# Patient Record
Sex: Female | Born: 2000 | Race: White | Hispanic: No | Marital: Single | Smoking: Former smoker
Health system: Southern US, Community
[De-identification: ages and names within clinical notes are randomized; demographics above are authoritative.]

## PROBLEM LIST (undated history)

## (undated) DIAGNOSIS — F419 Anxiety disorder, unspecified: Secondary | ICD-10-CM

## (undated) DIAGNOSIS — K219 Gastro-esophageal reflux disease without esophagitis: Secondary | ICD-10-CM

## (undated) DIAGNOSIS — G43909 Migraine, unspecified, not intractable, without status migrainosus: Secondary | ICD-10-CM

## (undated) DIAGNOSIS — J45909 Unspecified asthma, uncomplicated: Secondary | ICD-10-CM

## (undated) DIAGNOSIS — A749 Chlamydial infection, unspecified: Secondary | ICD-10-CM

## (undated) DIAGNOSIS — F319 Bipolar disorder, unspecified: Secondary | ICD-10-CM

## (undated) DIAGNOSIS — F259 Schizoaffective disorder, unspecified: Secondary | ICD-10-CM

## (undated) HISTORY — DX: Schizoaffective disorder, unspecified: F25.9

## (undated) HISTORY — DX: Anxiety disorder, unspecified: F41.9

## (undated) HISTORY — DX: Chlamydial infection, unspecified: A74.9

## (undated) HISTORY — DX: Bipolar disorder, unspecified: F31.9

## (undated) HISTORY — PX: NO PAST SURGERIES: SHX2092

---
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
## 2019-03-02 ENCOUNTER — Ambulatory Visit: Payer: BLUE CROSS/BLUE SHIELD | Admitting: Obstetrics and Gynecology

## 2019-03-04 ENCOUNTER — Other Ambulatory Visit: Payer: Self-pay

## 2019-03-04 ENCOUNTER — Encounter: Payer: Self-pay | Admitting: Obstetrics and Gynecology

## 2019-03-04 ENCOUNTER — Other Ambulatory Visit (HOSPITAL_COMMUNITY)
Admission: RE | Admit: 2019-03-04 | Discharge: 2019-03-04 | Disposition: A | Discharge status: Home / Self Care | Payer: Managed Care, Other (non HMO) | Source: Ambulatory Visit | Attending: Obstetrics and Gynecology | Admitting: Obstetrics and Gynecology

## 2019-03-04 ENCOUNTER — Ambulatory Visit (INDEPENDENT_AMBULATORY_CARE_PROVIDER_SITE_OTHER): Payer: Managed Care, Other (non HMO) | Admitting: Obstetrics and Gynecology

## 2019-03-04 VITALS — BP 118/74 | Ht 62.0 in | Wt 118.0 lb

## 2019-03-04 DIAGNOSIS — Z113 Encounter for screening for infections with a predominantly sexual mode of transmission: Secondary | ICD-10-CM | POA: Insufficient documentation

## 2019-03-04 DIAGNOSIS — Z30431 Encounter for routine checking of intrauterine contraceptive device: Secondary | ICD-10-CM

## 2019-03-04 DIAGNOSIS — O26899 Other specified pregnancy related conditions, unspecified trimester: Secondary | ICD-10-CM | POA: Diagnosis present

## 2019-03-04 DIAGNOSIS — R102 Pelvic and perineal pain: Secondary | ICD-10-CM | POA: Insufficient documentation

## 2019-03-04 DIAGNOSIS — R198 Other specified symptoms and signs involving the digestive system and abdomen: Secondary | ICD-10-CM | POA: Diagnosis not present

## 2019-03-04 DIAGNOSIS — Z3202 Encounter for pregnancy test, result negative: Secondary | ICD-10-CM | POA: Diagnosis not present

## 2019-03-04 LAB — POCT URINE PREGNANCY: Preg Test, Ur: NEGATIVE

## 2019-03-04 NOTE — Progress Notes (Signed)
Pa, [employer] Complaint  Patient presents with  . Follow-up  . Pelvic Pain    HPI:      [name] is a 18 y.o. G0P0000 who LMP was Patient's last menstrual period was 01/10/2019 (exact date)., presents today for IUD check. Pt had Kyleena inserted 04/02/17.  Has occas to monthly bleeding with IUD, lasting 4 days, light flow, no dysmen. Noticed epigastric/pelvic pain last wk with n/v and loose stools, for several days. Felt constipated and bloated, too. No fevers, no urin sx except urgency when bladder full and with good flow. Pt with extra mood changes recently and increased breast sx and concerned about pregnancy.   LMP was 01/10/19, didn't have menses earlier in month but now with spotting.  Pt is sex active, no new partners. No pain/bleeding with sex. Feels IUD strings in place. Neg STD testing 8/19. No vag sx.  Patient Active Problem List   Diagnosis Date Noted  . Other insomnia 04/09/2018  . Obsessive-compulsive disorder 02/26/2018  . 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 Mood disorder (HCC) 02/26/2018    Past Surgical History:  Procedure Laterality Date  . NO PAST SURGERIES      Family History  Problem Relation Age of Onset  . Hypotension Mother   . Alcohol abuse Mother   . Bipolar disorder Mother   . Anxiety disorder Mother   . Depression Mother   . Hyperlipidemia Father   . Hypertension Father   . Heart disease Paternal Grandmother   . Breast cancer Other 40  . Alcohol abuse Sister   . Bipolar disorder Sister   . Anxiety disorder Sister   . Depression Sister   . Drug abuse Maternal Aunt   . Bipolar disorder Maternal Aunt   . Anxiety disorder Maternal Aunt   . Depression Maternal Aunt   . Drug abuse Maternal Uncle   . Alcohol abuse Maternal Grandfather   . Drug abuse Maternal Grandfather   . Drug abuse Maternal Grandmother   . Alcohol abuse Paternal Grandfather     Social History   Socioeconomic History  . Marital  status: Significant Other    Spouse name: Not on file  . Number of children: 0  . Years of education: Not on file  . Highest education level: Some college, no degree  Occupational History    Comment: full time  Tobacco Use  . Smoking status: Former Smoker    Types: Cigarettes    Quit date: 02/13/2018    Years since quitting: 1.0  . Smokeless tobacco: Never Used  Substance and Sexual Activity  . Alcohol use: No  . Drug use: No  . Sexual activity: Yes    Birth control/protection: I.U.D.    Comment: Kyleena   Other Topics Concern  . Not on file  Social History Narrative  . Not on file   Social Determinants of Health   Financial Resource Strain:   . Difficulty of Paying Living Expenses: Not on file  Food Insecurity:   . Worried About Programme researcher, broadcasting/film/video in the Last Year: Not on file  . Ran Out of Food in the Last Year: Not on file  Transportation Needs:   . Lack of Transportation (Medical): Not on file  . Lack of Transportation (Non-Medical): Not on file  Physical Activity:   . Days of Exercise per Week: Not on file  . Minutes of Exercise per Session: Not on file  Stress:   . Feeling  of Stress : Not on file  Social Connections:   . Frequency of Communication with Friends and Family: Not on file  . Frequency of Social Gatherings with Friends and Family: Not on file  . Attends Religious Services: Not on file  . Active Member of Clubs or Organizations: Not on file  . Attends BankerClub or Organization Meetings: Not on file  . Marital Status: Not on file  Intimate Partner Violence:   . Fear of Current or Ex-Partner: Not on file  . Emotionally Abused: Not on file  . Physically Abused: Not on file  . Sexually Abused: Not on file    Outpatient Medications Prior to Visit  Medication Sig Dispense Refill  . ARIPiprazole (ABILIFY) 10 MG tablet Take 0.5 tablets (5 mg total) by mouth daily. (Patient not taking: Reported on 03/04/2019) 15 tablet 0  . ARIPiprazole (ABILIFY) 5 MG tablet  Take 0.5 tablets (2.5 mg total) by mouth daily for 30 days. 15 tablet 0  . Cetirizine HCl (ZYRTEC ALLERGY PO) Take by mouth.    . Levonorgestrel (KYLEENA) 19.5 MG IUD 19.5 mg by Intrauterine route once for 1 dose. 1 Intra Uterine Device 0  . traZODone (DESYREL) 50 MG tablet Take 1 tablet (50 mg total) by mouth at bedtime. (Patient not taking: Reported on 03/04/2019) 30 tablet 0   No facility-administered medications prior to visit.      ROS:  Review of Systems  Constitutional: Positive for fatigue. Negative for fever and unexpected weight change.  Respiratory: Negative for cough, shortness of breath and wheezing.   Cardiovascular: Negative for chest pain, palpitations and leg swelling.  Gastrointestinal: Positive for abdominal distention, constipation, diarrhea, nausea and vomiting. Negative for blood in stool.  Endocrine: Negative for cold intolerance, heat intolerance and polyuria.  Genitourinary: Positive for frequency and pelvic pain. Negative for dyspareunia, dysuria, flank pain, genital sores, hematuria, menstrual problem, urgency, vaginal bleeding, vaginal discharge and vaginal pain.  Musculoskeletal: Negative for back pain, joint swelling and myalgias.  Skin: Negative for rash.  Neurological: Positive for dizziness and headaches. Negative for syncope, light-headedness and numbness.  Hematological: Negative for adenopathy.  Psychiatric/Behavioral: Positive for agitation. Negative for confusion, sleep disturbance and suicidal ideas. The patient is not nervous/anxious.   BREAST: heaviness   OBJECTIVE:   Vitals:  BP 118/74   Ht 5\' 2"  (1.575 m)   Wt 118 lb (53.5 kg)   LMP 01/10/2019 (Exact Date)   BMI 21.58 kg/m   Physical Exam Vitals reviewed.  Constitutional:      Appearance: She is well-developed.  Pulmonary:     Effort: Pulmonary effort is normal.  Abdominal:     Palpations: Abdomen is soft.     Tenderness: There is abdominal tenderness in the right lower quadrant,  suprapubic area and left lower quadrant. There is no guarding or rebound.  Genitourinary:    General: Normal vulva.     Pubic Area: No rash.      Labia:        Right: No rash, tenderness or lesion.        Left: No rash, tenderness or lesion.      Vagina: Bleeding present. No vaginal discharge, erythema or tenderness.     Cervix: No cervical motion tenderness.     Uterus: Normal. Tender. Not enlarged.      Adnexa:        Right: Tenderness present. No mass.         Left: Tenderness present. No mass.  Comments: IUD STRINGS IN CX OS; NO CMT Musculoskeletal:        General: Normal range of motion.     Cervical back: Normal range of motion.  Skin:    General: Skin is warm and dry.  Neurological:     General: No focal deficit present.     Mental Status: She is alert and oriented to person, place, and time.  Psychiatric:        Mood and Affect: Mood normal.        Behavior: Behavior normal.        Thought Content: Thought content normal.        Judgment: Judgment normal.     Results: Results for orders placed or performed in visit on 03/04/19 (from the past 24 hour(s))  POCT urine pregnancy     Status: Normal   Collection Time: 03/04/19 11:22 AM  Result Value Ref Range   Preg Test, Ur Negative Negative    Assessment/Plan: Pelvic pain - Plan: [place] STD, POCT urine pregnancy, For past wk, initially with GI sx, now with menses. Neg UPT. Tender on exam. Rule out STDs. If neg, see if sx improve with menses. If sx persist, will check GYN ultrasound.  Encounter for routine checking of intrauterine contraceptive device (IUD)--IUD strings in cx os.  Screening for STD (sexually transmitted disease) - Plan: [place] STD  GI symptoms--last wk. Question viral vs covid. Sx more menstrual now.    Return if symptoms worsen or fail to improve.  Hector Venne B. Alexina Niccoli, PA-C 03/04/2019 11:46 AM

## 2019-03-04 NOTE — Patient Instructions (Signed)
I value your feedback and entrusting us with your care. If you get a [facility] patient survey, I would appreciate you taking the time to let us know about your experience today. Thank you!  As of February 18, 2019, your lab results will be released to your MyChart immediately, before I even have a chance to see them. Please give me time to review them and contact you if there are any abnormalities. Thank you for your patience.  

## 2019-03-08 LAB — CERVICOVAGINAL ANCILLARY ONLY
Chlamydia: POSITIVE — AB
Comment: NEGATIVE
Comment: NORMAL
Neisseria Gonorrhea: NEGATIVE

## 2019-03-09 ENCOUNTER — Telehealth: Payer: Self-pay | Admitting: Obstetrics and Gynecology

## 2019-03-09 DIAGNOSIS — A749 Chlamydial infection, unspecified: Secondary | ICD-10-CM

## 2019-03-09 MED ORDER — AZITHROMYCIN 500 MG PO TABS: 1000.0000 mg | ORAL_TABLET | Freq: Once | ORAL | 0 refills | Status: AC

## 2019-03-09 NOTE — Telephone Encounter (Signed)
DONE

## 2019-03-09 NOTE — Telephone Encounter (Signed)
Pt aware of pos chlamydia. Rx azithro eRxd. Partner needs tx. RTO in 4 wks for TOC. RN to notify [facility].

## 2019-04-07 ENCOUNTER — Other Ambulatory Visit (HOSPITAL_COMMUNITY)
Admission: RE | Admit: 2019-04-07 | Discharge: 2019-04-07 | Disposition: A | Discharge status: Home / Self Care | Payer: Managed Care, Other (non HMO) | Source: Ambulatory Visit | Attending: Obstetrics and Gynecology | Admitting: Obstetrics and Gynecology

## 2019-04-07 ENCOUNTER — Other Ambulatory Visit: Payer: Self-pay

## 2019-04-07 ENCOUNTER — Ambulatory Visit (INDEPENDENT_AMBULATORY_CARE_PROVIDER_SITE_OTHER): Payer: Managed Care, Other (non HMO) | Admitting: Obstetrics and Gynecology

## 2019-04-07 ENCOUNTER — Encounter: Payer: Self-pay | Admitting: Obstetrics and Gynecology

## 2019-04-07 VITALS — BP 90/70 | Ht 62.0 in | Wt 119.0 lb

## 2019-04-07 DIAGNOSIS — Z113 Encounter for screening for infections with a predominantly sexual mode of transmission: Secondary | ICD-10-CM | POA: Insufficient documentation

## 2019-04-07 DIAGNOSIS — A749 Chlamydial infection, unspecified: Secondary | ICD-10-CM | POA: Insufficient documentation
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
## 2019-04-07 NOTE — Patient Instructions (Signed)
I value your feedback and entrusting us with your care. If you get a [facility] patient survey, I would appreciate you taking the time to let us know about your experience today. Thank you!  As of February 18, 2019, your lab results will be released to your MyChart immediately, before I even have a chance to see them. Please give me time to review them and contact you if there are any abnormalities. Thank you for your patience.  

## 2019-04-09 LAB — CERVICOVAGINAL ANCILLARY ONLY
Chlamydia: NEGATIVE
Comment: NEGATIVE
Comment: NORMAL
Neisseria Gonorrhea: NEGATIVE

## 2019-04-12 ENCOUNTER — Telehealth: Payer: Self-pay

## 2019-04-12 NOTE — Telephone Encounter (Signed)
Pt aware of STD neg results.

## 2019-04-12 NOTE — Telephone Encounter (Signed)
-----   Message from Rica Records, PA-C sent at 04/10/2019  3:22 PM EST ----- Regarding: FW: STD--1/27 Pls let pt know STD testing was negative.  Thx.  ----- Message ----- From: Trenda Moots Sent: 04/07/2019   3:47 PM EST To: Ilona Sorrel Copland, PA-C Subject: STD--1/27

## 2019-06-06 ENCOUNTER — Emergency Department
Admission: EM | Admit: 2019-06-06 | Discharge: 2019-06-07 | Disposition: A | Discharge status: Other Acute Inpatient Hospital | Payer: Managed Care, Other (non HMO) | Source: Home / Self Care | Attending: Emergency Medicine | Admitting: Emergency Medicine

## 2019-06-06 ENCOUNTER — Other Ambulatory Visit: Payer: Self-pay

## 2019-06-06 DIAGNOSIS — F429 Obsessive-compulsive disorder, unspecified: Secondary | ICD-10-CM | POA: Insufficient documentation

## 2019-06-06 DIAGNOSIS — F29 Unspecified psychosis not due to a substance or known physiological condition: Secondary | ICD-10-CM

## 2019-06-06 DIAGNOSIS — F419 Anxiety disorder, unspecified: Secondary | ICD-10-CM | POA: Diagnosis not present

## 2019-06-06 DIAGNOSIS — G4709 Other insomnia: Secondary | ICD-10-CM | POA: Diagnosis present

## 2019-06-06 DIAGNOSIS — F418 Other specified anxiety disorders: Secondary | ICD-10-CM | POA: Diagnosis present

## 2019-06-06 DIAGNOSIS — F1220 Cannabis dependence, uncomplicated: Secondary | ICD-10-CM | POA: Insufficient documentation

## 2019-06-06 DIAGNOSIS — Z20822 Contact with and (suspected) exposure to covid-19: Secondary | ICD-10-CM | POA: Insufficient documentation

## 2019-06-06 DIAGNOSIS — F3160 Bipolar disorder, current episode mixed, unspecified: Secondary | ICD-10-CM | POA: Diagnosis not present

## 2019-06-06 DIAGNOSIS — F410 Panic disorder [episodic paroxysmal anxiety] without agoraphobia: Secondary | ICD-10-CM | POA: Insufficient documentation

## 2019-06-06 DIAGNOSIS — F39 Unspecified mood [affective] disorder: Secondary | ICD-10-CM | POA: Diagnosis present

## 2019-06-06 DIAGNOSIS — F315 Bipolar disorder, current episode depressed, severe, with psychotic features: Secondary | ICD-10-CM | POA: Insufficient documentation

## 2019-06-06 LAB — COMPREHENSIVE METABOLIC PANEL
ALT: 17 U/L (ref 0–44)
AST: 20 U/L (ref 15–41)
Albumin: 4.2 g/dL (ref 3.5–5.0)
Alkaline Phosphatase: 65 U/L (ref 38–126)
Anion gap: 8 (ref 5–15)
BUN: 14 mg/dL (ref 6–20)
CO2: 22 mmol/L (ref 22–32)
Calcium: 8.8 mg/dL — ABNORMAL LOW (ref 8.9–10.3)
Chloride: 109 mmol/L (ref 98–111)
Creatinine, Ser: 0.5 mg/dL (ref 0.44–1.00)
GFR calc Af Amer: >60 mL/min (ref >60)
GFR calc non Af Amer: >60 mL/min (ref >60)
Glucose, Bld: 93 mg/dL (ref 70–99)
Potassium: 3.5 mmol/L (ref 3.5–5.1)
Sodium: 139 mmol/L (ref 135–145)
Total Bilirubin: 0.3 mg/dL (ref 0.3–1.2)
Total Protein: 7 g/dL (ref 6.5–8.1)

## 2019-06-06 LAB — CBC
HCT: 37.6 % (ref 36.0–46.0)
Hemoglobin: 12.2 g/dL (ref 12.0–15.0)
MCH: 28.1 pg (ref 26.0–34.0)
MCHC: 32.4 g/dL (ref 30.0–36.0)
MCV: 86.6 fL (ref 80.0–100.0)
Platelets: 305 10*3/uL (ref 150–400)
RBC: 4.34 MIL/uL (ref 3.87–5.11)
RDW: 13.2 % (ref 11.5–15.5)
WBC: 8.2 10*3/uL (ref 4.0–10.5)
nRBC: 0 % (ref 0.0–0.2)

## 2019-06-06 LAB — URINE DRUG SCREEN, QUALITATIVE (ARMC ONLY)
Amphetamines, Ur Screen: NOT DETECTED
Barbiturates, Ur Screen: NOT DETECTED
Benzodiazepine, Ur Scrn: NOT DETECTED
Cannabinoid 50 Ng, Ur ~~LOC~~: POSITIVE — AB
Cocaine Metabolite,Ur ~~LOC~~: NOT DETECTED
MDMA (Ecstasy)Ur Screen: NOT DETECTED
Methadone Scn, Ur: NOT DETECTED
Opiate, Ur Screen: NOT DETECTED
Phencyclidine (PCP) Ur S: NOT DETECTED
Tricyclic, Ur Screen: NOT DETECTED

## 2019-06-06 LAB — ETHANOL: Alcohol, Ethyl (B): <10 mg/dL (ref <10)

## 2019-06-06 LAB — SALICYLATE LEVEL: Salicylate Lvl: <7 mg/dL — ABNORMAL LOW (ref 7.0–30.0)

## 2019-06-06 LAB — ACETAMINOPHEN LEVEL: Acetaminophen (Tylenol), Serum: <10 ug/mL — ABNORMAL LOW (ref 10–30)

## 2019-06-06 NOTE — ED Notes (Signed)
Pt changed from personal belongings into hospital provided scrubs. All belongings placed in bag and assisted by female Engineer, materials.
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
## 2019-06-06 NOTE — ED Notes (Signed)
VOL/Consult ordered/SOC called/ waiting on evaluation 

## 2019-06-06 NOTE — ED Notes (Signed)
Hourly rounding reveals patient awake in hall bed. No complaints, stable, in no acute distress. Q15 minute rounds and monitoring via Rover and Officer to continue.  

## 2019-06-06 NOTE — ED Triage Notes (Signed)
Pt comes to ED via POV with request for psych evaluation. PT states that she has a psych history and stopped all psych medications in March 2020 and was using psychedelics until recently. At this time Pt states she is not "in her proper head space" at this time and desires to return to taking her psych medications. Pt reports suicidal thoughts with no intent and states these thoughts are easily redirected by self.

## 2019-06-06 NOTE — ED Notes (Signed)
Pt. Transferred from Triage to 20 hall after dressing out and screening for contraband. Pt. Oriented to Quad including Q15 minute rounds as well as Psychologist, counselling for their protection. Patient is alert and oriented, warm and dry in no acute distress. Patient denies SI, and HI. Patient states she has AH that is without command and VH of flashes in perpheral vision. Pt. Encouraged to let me know if needs arise.
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
## 2019-06-07 ENCOUNTER — Inpatient Hospital Stay
Admit: 2019-06-07 | Discharge: 2019-06-10 | DRG: 885 | Disposition: A | Discharge status: Home / Self Care | Payer: Managed Care, Other (non HMO) | Attending: Psychiatry | Admitting: Psychiatry

## 2019-06-07 ENCOUNTER — Encounter: Payer: Self-pay | Admitting: Behavioral Health

## 2019-06-07 DIAGNOSIS — G4700 Insomnia, unspecified: Secondary | ICD-10-CM | POA: Diagnosis present

## 2019-06-07 DIAGNOSIS — R45851 Suicidal ideations: Secondary | ICD-10-CM | POA: Diagnosis present

## 2019-06-07 DIAGNOSIS — F39 Unspecified mood [affective] disorder: Secondary | ICD-10-CM | POA: Diagnosis present

## 2019-06-07 DIAGNOSIS — Z975 Presence of (intrauterine) contraceptive device: Secondary | ICD-10-CM | POA: Diagnosis not present

## 2019-06-07 DIAGNOSIS — F22 Delusional disorders: Secondary | ICD-10-CM | POA: Diagnosis present

## 2019-06-07 DIAGNOSIS — F319 Bipolar disorder, unspecified: Secondary | ICD-10-CM | POA: Diagnosis present

## 2019-06-07 DIAGNOSIS — F419 Anxiety disorder, unspecified: Secondary | ICD-10-CM | POA: Diagnosis present

## 2019-06-07 DIAGNOSIS — F429 Obsessive-compulsive disorder, unspecified: Secondary | ICD-10-CM | POA: Diagnosis present

## 2019-06-07 DIAGNOSIS — Z818 Family history of other mental and behavioral disorders: Secondary | ICD-10-CM | POA: Diagnosis not present

## 2019-06-07 DIAGNOSIS — Z91018 Allergy to other foods: Secondary | ICD-10-CM | POA: Diagnosis not present

## 2019-06-07 DIAGNOSIS — Z87891 Personal history of nicotine dependence: Secondary | ICD-10-CM | POA: Diagnosis not present

## 2019-06-07 DIAGNOSIS — Z91012 Allergy to eggs: Secondary | ICD-10-CM | POA: Diagnosis not present

## 2019-06-07 DIAGNOSIS — Z20822 Contact with and (suspected) exposure to covid-19: Secondary | ICD-10-CM | POA: Diagnosis present

## 2019-06-07 DIAGNOSIS — F3160 Bipolar disorder, current episode mixed, unspecified: Principal | ICD-10-CM | POA: Diagnosis present

## 2019-06-07 DIAGNOSIS — Z91010 Allergy to peanuts: Secondary | ICD-10-CM | POA: Diagnosis not present

## 2019-06-07 DIAGNOSIS — Z91013 Allergy to seafood: Secondary | ICD-10-CM | POA: Diagnosis not present

## 2019-06-07 DIAGNOSIS — Z79899 Other long term (current) drug therapy: Secondary | ICD-10-CM | POA: Diagnosis not present

## 2019-06-07 DIAGNOSIS — F1290 Cannabis use, unspecified, uncomplicated: Secondary | ICD-10-CM | POA: Diagnosis present

## 2019-06-07 LAB — RESPIRATORY PANEL BY RT PCR (FLU A&B, COVID)
Influenza A by PCR: NEGATIVE
Influenza B by PCR: NEGATIVE
SARS Coronavirus 2 by RT PCR: NEGATIVE

## 2019-06-07 MED ORDER — MAGNESIUM HYDROXIDE 400 MG/5ML PO SUSP: 30.0000 mL | Freq: Every day | ORAL | Status: DC | PRN

## 2019-06-07 MED ORDER — ACETAMINOPHEN 325 MG PO TABS: 650.0000 mg | ORAL_TABLET | Freq: Four times a day (QID) | ORAL | Status: DC | PRN

## 2019-06-07 MED ORDER — HYDROXYZINE HCL 25 MG PO TABS
25.0000 mg | ORAL_TABLET | Freq: Three times a day (TID) | ORAL | Status: DC | PRN
  Administered 2019-06-10: 25 mg via ORAL
  Filled 2019-06-07 (×2): qty 1

## 2019-06-07 MED ORDER — CETIRIZINE HCL 10 MG PO TABS
10.0000 mg | ORAL_TABLET | Freq: Every day | ORAL | Status: DC
  Administered 2019-06-07 – 2019-06-10 (×4): 10 mg via ORAL
  Filled 2019-06-07 (×4): qty 1

## 2019-06-07 MED ORDER — ALUM & MAG HYDROXIDE-SIMETH 200-200-20 MG/5ML PO SUSP: 30.0000 mL | ORAL | Status: DC | PRN

## 2019-06-07 MED ORDER — ADULT MULTIVITAMIN W/MINERALS CH
1.0000 | ORAL_TABLET | Freq: Every day | ORAL | Status: DC
  Administered 2019-06-08 – 2019-06-10 (×3): 1 via ORAL
  Filled 2019-06-07 (×3): qty 1

## 2019-06-07 MED ORDER — ARIPIPRAZOLE 5 MG PO TABS
15.0000 mg | ORAL_TABLET | Freq: Every day | ORAL | Status: DC
  Administered 2019-06-07 – 2019-06-08 (×2): 15 mg via ORAL
  Filled 2019-06-07 (×2): qty 3

## 2019-06-07 MED ORDER — TRAZODONE HCL 50 MG PO TABS
50.0000 mg | ORAL_TABLET | Freq: Every day | ORAL | Status: DC
  Administered 2019-06-07: 50 mg via ORAL
  Filled 2019-06-07: qty 1

## 2019-06-07 MED ORDER — BOOST / RESOURCE BREEZE PO LIQD CUSTOM
1.0000 | Freq: Three times a day (TID) | ORAL | Status: DC
  Administered 2019-06-08 – 2019-06-10 (×6): 1 via ORAL

## 2019-06-07 NOTE — Plan of Care (Signed)
Pt stated that she was feeling better and didn't endorse any SI/HI/AVH/anxiety/depression  Problem: Education: Goal: Knowledge of [facility] Education information/materials will improve Outcome: Not Progressing

## 2019-06-07 NOTE — Progress Notes (Signed)
Recreation Therapy Notes  Date: 06/07/2019  Time: 9:30 am  Location: Craft Room  Behavioral response: Appropriate   Intervention Topic: Coping skills   Discussion/Intervention:  Group content on today was focused on coping skills. The group defined what coping skills are and when they can be used. Individuals described how they normally cope with thing and the coping skills they normally use. Patients expressed why it is important to cope with things and how not coping with things can affect you. The group participated in the intervention "My coping box" and made coping boxes while adding coping skills they could use in the future to the box. Clinical Observations/Feedback:  Patient came to group and stated that she likes to cope with things by drawing, physical activity, and music. She explained that coping skills are based on the level of intensity of a situation. Participant expressed that coping skills are important because it helps her keep her composure. Individual was social with peers and staff while participant in the intervention during group.  Vincy Feliz LRT/CTRS           Dajion Bickford 06/07/2019 2:06 PM
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
## 2019-06-07 NOTE — ED Notes (Signed)
Hourly rounding reveals patient awake in hall bed. No complaints, stable, in no acute distress. Q15 minute rounds and monitoring via Rover and Officer to continue.  
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
## 2019-06-07 NOTE — BHH Group Notes (Signed)
LCSW Group Therapy Note  06/07/2019 1:42 PM  Type of Therapy and Topic:  Group Therapy:  Feelings around Relapse and Recovery  Participation Level:  Active   Description of Group:    Patients in this group will discuss emotions they experience before and after a relapse. They will process how experiencing these feelings, or avoidance of experiencing them, relates to having a relapse. Facilitator will guide patients to explore emotions they have related to recovery. Patients will be encouraged to process which emotions are more powerful. They will be guided to discuss the emotional reaction significant others in their lives may have to their relapse or recovery. Patients will be assisted in exploring ways to respond to the emotions of others without this contributing to a relapse.  Therapeutic Goals: 1. Patient will identify two or more emotions that lead to a relapse for them 2. Patient will identify two emotions that result when they relapse 3. Patient will identify two emotions related to recovery 4. Patient will demonstrate ability to communicate their needs through discussion and/or role plays   Summary of Patient Progress: Pt was appropriate and respectful in group. Pt was able to identify her goal for relapse prevention and strategies and steps to take towards achieving her goal. Pt reported that she plans to stay away from some old friends in order to achieve her goals. Pt identified that she sometimes tries to make a list to hold herself accountable, but typically overloads her list and stresses herself out more.    Therapeutic Modalities:   Cognitive Behavioral Therapy Solution-Focused Therapy Assertiveness Training Relapse Prevention Therapy   Iris Pert, MSW, LCSW Clinical Social Work 06/07/2019 1:42 PM

## 2019-06-07 NOTE — BHH Group Notes (Signed)
B[facility] Group Notes:  (Nursing/MHT/Case Management/Adjunct)  Date:  06/07/2019  Time:  9:53 AM  Type of Therapy:  Community Meeting  Participation Level:  Active  Participation Quality:  Appropriate, Attentive and Sharing  Affect:  Appropriate  Cognitive:  Alert and Appropriate  Insight:  Appropriate and Good  Engagement in Group:  Engaged  Modes of Intervention:  Discussion, Education and Support  Summary of Progress/Problems:  Lynelle Smoke [facility] 06/07/2019, 9:53 AM

## 2019-06-07 NOTE — Progress Notes (Signed)
Patient was in the day room upon arrival to the unit. Pt pleasant during assessment denying SI/HI/AVH, pain, depression and anxiety with this Clinical research associate. Patient compliant with medications per MD orders. Pt observed interacting approprietly with staff and peers on the unit by this Clinical research associate. Pt given education, support and encouragement to be active in her treatment plan. Patient being monitored Q 15 minutes for safety per unit protocol, remains safe on the unit.

## 2019-06-07 NOTE — BHH Suicide Risk Assessment (Signed)
B[facility] INPATIENT:  Family/Significant Other Suicide Prevention Education  Suicide Prevention Education:  Education Completed; Neave Lenger, father [phone] has been identified by the patient as the family member/significant other with whom the patient will be residing, and identified as the person(s) who will aid the patient in the event of a mental health crisis (suicidal ideations/suicide attempt).  With written consent from the patient, the family member/significant other has been provided the following suicide prevention education, prior to the and/or following the discharge of the patient.  The suicide prevention education provided includes the following:  Suicide risk factors  Suicide prevention and interventions  National Suicide Hotline telephone number  [facility] assessment telephone number  [facility] Emergency Assistance 911  [facility] and/or Residential Mobile Crisis Unit telephone number  Request made of family/significant other to:  Remove weapons (e.g., guns, rifles, knives), all items previously/currently identified as safety concern.    Remove drugs/medications (over-the-counter, prescriptions, illicit drugs), all items previously/currently identified as a safety concern.  The family member/significant other verbalizes understanding of the suicide prevention education information provided.  The family member/significant other agrees to remove the items of safety concern listed above. Mr. Kittel reports the pt was brought to the hospital because she reported "seeing people and hearing voices." He states the pt isolates self from others and can be guarded at times. He reports the pt is living between her grandmothers home and his house. Mr. Pavlovich states owning a firearm but says it is locked and secure and pt has no access to it. Mr. [place] request that the pt be referred for outpatient therapy.  Jette Lewan T Shanzay Hepworth 06/07/2019, 2:04 PM
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
## 2019-06-07 NOTE — Progress Notes (Addendum)
NUTRITION ASSESSMENT RD working remotely.  Pt identified as at risk on the Malnutrition Screen Tool  INTERVENTION: Provide Boost Breeze po TID, each supplement provides 250 kcal and 9 grams of protein.  Cannot order Ensure Enlive as patient has a documented soy allergy.  Provide daily MVI.  NUTRITION DIAGNOSIS: Unintentional weight loss related to sub-optimal intake as evidenced by pt report.   Goal: Pt to meet >/= 90% of their estimated nutrition needs.  Monitor:  PO intake  Assessment:  19 y.o. female who presented voluntarily for passive suicidal ideation.  Noted in chart patient reported poor appetite and intake and weight loss of 10 lbs in the last month. Patient currently ordered for regular diet. No meal completion documentation available at this time so unable to tell how she is eating here.  According to weight history in chart patient was 54 kg on 04/07/2019. She is now 49.9 kg. She has lost 4.1 kg (7.6% body weight) some time in the past 2 months, which is significant for time frame. Per patient report the weight loss occurred over one month, which is even more significant for time frame.   Patient's BMI-for-age percentile is 29.53%. This meals she is at a healthy weight for her age. However, it is still concerning she has had significant weight loss.  Medications and labs reviewed.  Patient is at risk for malnutrition.  Height: Ht Readings from Last 1 Encounters:  06/07/19 5\' 2"  (1.575 m) (18 %, Z= -0.90)*   * Growth percentiles are based on CDC (Girls, 2-20 Years) data.   Weight: Wt Readings from Last 1 Encounters:  06/07/19 49.9 kg (15 %, Z= -1.03)*   * Growth percentiles are based on CDC (Girls, 2-20 Years) data.   Weight Hx: Wt Readings from Last 10 Encounters:  06/07/19 49.9 kg (15 %, Z= -1.03)*  06/06/19 49.9 kg (15 %, Z= -1.03)*  04/07/19 54 kg (33 %, Z= -0.45)*  03/04/19 53.5 kg (31 %, Z= -0.49)*  05/05/18 50.7 kg (22 %, Z= -0.78)*  04/09/18  48.8 kg (14 %, Z= -1.08)*  02/26/18 48.4 kg (13 %, Z= -1.14)*  02/16/18 49.5 kg (17 %, Z= -0.94)*  11/04/17 52.6 kg (33 %, Z= -0.45)*  05/07/17 53.1 kg (37 %, Z= -0.32)*   * Growth percentiles are based on CDC (Girls, 2-20 Years) data.   BMI:  Body mass index is 20.12 kg/m. Pt meets criteria for normal weight based on current BMI.  Estimated Nutritional Needs: Kcal: 25-30 kcal/kg Protein: > 1 gram protein/kg Fluid: 1 ml/kcal  Diet Order:  Diet Order            Diet regular Room service appropriate? Yes; Fluid consistency: Thin  Diet effective now             Pt is also offered choice of unit snacks mid-morning and mid-afternoon.   05/09/17, MS, RD, LDN Pager number available on Amion

## 2019-06-07 NOTE — Plan of Care (Signed)
D- Patient alert and oriented. Patient presents in an anxious, but pleasant mood on assessment stating that she slept "decent" last night and had no complaints to voice to this Clinical research associate. Patient denies depression, but endorsed anxiety, reporting that she had a panic attack last night and "not knowing", is what made her feel this way. Patient also denies SI, HI, AVH, and pain at this time. Patient's goal for today is "coping with being here, getting settled in, first steps toward goal", in which she will "try to interact with people" in order to achieve her goal.  A- Scheduled medications administered to patient, per MD orders. Support and encouragement provided.  Routine safety checks conducted every 15 minutes.  Patient informed to notify staff with problems or concerns.  R- No adverse drug reactions noted. Patient contracts for safety at this time. Patient compliant with medications and treatment plan. Patient receptive, calm, and cooperative. Patient interacts well with others on the unit.  Patient remains safe at this time.  Problem: Education: Goal: Knowledge of [place] [facility] Education information/materials will improve Outcome: Progressing Goal: Emotional status will improve Outcome: Progressing Goal: Mental status will improve Outcome: Progressing Goal: Verbalization of understanding the information provided will improve Outcome: Progressing   Problem: Activity: Goal: Interest or engagement in activities will improve Outcome: Progressing Goal: Sleeping patterns will improve Outcome: Progressing   Problem: Coping: Goal: Ability to verbalize frustrations and anger appropriately will improve Outcome: Progressing Goal: Ability to demonstrate self-control will improve Outcome: Progressing   Problem: Health Behavior/Discharge Planning: Goal: Identification of resources available to assist in meeting health care needs will improve Outcome: Progressing Goal: Compliance with  treatment plan for underlying cause of condition will improve Outcome: Progressing   Problem: Physical Regulation: Goal: Ability to maintain clinical measurements within normal limits will improve Outcome: Progressing   Problem: Safety: Goal: Periods of time without injury will increase Outcome: Progressing

## 2019-06-07 NOTE — Plan of Care (Signed)
  Problem: Education: Goal: Knowledge of  General Education information/materials will improve Outcome: Not Progressing Goal: Emotional status will improve Outcome: Not Progressing Goal: Mental status will improve Outcome: Not Progressing Goal: Verbalization of understanding the information provided will improve Outcome: Not Progressing   Problem: Activity: Goal: Interest or engagement in activities will improve Outcome: Not Progressing Goal: Sleeping patterns will improve Outcome: Not Progressing   Problem: Coping: Goal: Ability to verbalize frustrations and anger appropriately will improve Outcome: Not Progressing Goal: Ability to demonstrate self-control will improve Outcome: Not Progressing   Problem: Health Behavior/Discharge Planning: Goal: Identification of resources available to assist in meeting health care needs will improve Outcome: Not Progressing Goal: Compliance with treatment plan for underlying cause of condition will improve Outcome: Not Progressing   Problem: Physical Regulation: Goal: Ability to maintain clinical measurements within normal limits will improve Outcome: Not Progressing

## 2019-06-08 MED ORDER — TRAZODONE HCL 50 MG PO TABS
50.0000 mg | ORAL_TABLET | Freq: Every evening | ORAL | Status: DC | PRN
  Administered 2019-06-08 – 2019-06-09 (×2): 50 mg via ORAL
  Filled 2019-06-08 (×2): qty 1

## 2019-06-08 MED ORDER — ARIPIPRAZOLE 10 MG PO TABS
10.0000 mg | ORAL_TABLET | Freq: Every day | ORAL | Status: DC
  Administered 2019-06-09 – 2019-06-10 (×2): 10 mg via ORAL
  Filled 2019-06-08 (×2): qty 1

## 2019-06-08 NOTE — Progress Notes (Signed)
Patient was in the day room upon arrival to the unit. Pt pleasant during assessment denying SI/HI/AVH, pain, depression and anxiety with this writer. Patient compliant with medications per MD orders. Pt observed interacting approprietly with staff and peers on the unit by this writer. Pt given education, support and encouragement to be active in her treatment plan. Patient being monitored Q 15 minutes for safety per unit protocol, remains safe on the unit.  

## 2019-06-08 NOTE — Progress Notes (Signed)
   06/08/19 1400  Clinical Encounter Type  Visited With Patient;Other (Comment)  Visit Type Spiritual support;Social support;Behavioral Health  Referral From Chaplain  Consult/Referral To Chaplain  Chaplain conducted a group on Dealing with your Thoughts and Assumptions and patient participated.  

## 2019-06-08 NOTE — Plan of Care (Signed)
Pt stated she had a good day and is feeling better   Problem: Education: Goal: Emotional status will improve Outcome: Progressing Goal: Mental status will improve Outcome: Progressing

## 2019-06-08 NOTE — Progress Notes (Signed)
Recreation Therapy Notes   Date: 06/08/2019  Time: 9:30 am  Location: Craft Room  Behavioral response: Appropriate   Intervention Topic: Self-care   Discussion/Intervention:  Group content today was focused on Self-Care. The group defined self-care and some positive ways they care for themselves. Individuals expressed ways and reasons why they neglected any self-care in the past. Patients described ways to improve self-care in the future. The group explained what could happen if they did not do any self-care activities at all. The group participated in the intervention "self-care assessment" where they had a chance to discover some of their weaknesses and strengths in self- care. Patient came up with a self-care plan to improve themselves in the future.  Clinical Observations/Feedback:  Patient came to group and explained that she participates in self-care by walking and being a positive influence. She expressed that she has neglected self-care in the past due to her being forgetful and lack of time management. Participant stated that self-care is important so that you do not get caught up in negative habits. Individual was social with peers and staff while participant in the intervention during group.  Tina [name] LRT/CTRS         Tina [name] 06/08/2019 11:11 AM
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
## 2019-06-08 NOTE — [facility] Group Notes (Signed)
[facility] Group Notes:  (Nursing/MHT/Case Management/Adjunct)  Date:  06/08/2019  Time:  1:07 PM  Type of Therapy:  Community Meeting  Participation Level:  Active  Participation Quality:  Appropriate, Attentive and Sharing  Affect:  Appropriate  Cognitive:  Alert and Appropriate  Insight:  Appropriate  Engagement in Group:  Engaged  Modes of Intervention:  Discussion, Education and Support  Summary of Progress/Problems:  Lynelle Smoke [place] 06/08/2019, 1:07 PM

## 2019-06-08 NOTE — Tx Team (Addendum)
Interdisciplinary Treatment and Diagnostic Plan Update  06/08/2019 Time of Session: 900am Tina [name] MRN: [id number]  Principal Diagnosis: Bipolar 1 disorder (HCC)  Secondary Diagnoses: Principal Problem:   Bipolar 1 disorder (HCC) Active Problems:   Obsessive-compulsive disorder   Current Medications:  Current Facility-Administered Medications  Medication Dose Route Frequency Provider Last Rate Last Admin  . acetaminophen (TYLENOL) tablet 650 mg  650 mg Oral Q6H PRN Gillermo Murdoch, NP      . alum & mag hydroxide-simeth (MAALOX/MYLANTA) 200-200-20 MG/5ML suspension 30 mL  30 mL Oral Q4H PRN Gillermo Murdoch, NP      . Melene Muller ON 06/09/2019] ARIPiprazole (ABILIFY) tablet 10 mg  10 mg Oral Daily Clapacs, John T, MD      . cetirizine (ZYRTEC) tablet 10 mg  10 mg Oral Daily Gillermo Murdoch, NP   10 mg at 06/08/19 0829  . feeding supplement (BOOST / RESOURCE BREEZE) liquid 1 Container  1 Container Oral TID BM Clapacs, John T, MD      . hydrOXYzine (ATARAX/VISTARIL) tablet 25 mg  25 mg Oral TID PRN Money, Gerlene Burdock, FNP      . magnesium hydroxide (MILK OF MAGNESIA) suspension 30 mL  30 mL Oral Daily PRN Gillermo Murdoch, NP      . multivitamin with minerals tablet 1 tablet  1 tablet Oral Daily Clapacs, Jackquline Denmark, MD   1 tablet at 06/08/19 [phone]  . traZODone (DESYREL) tablet 50 mg  50 mg Oral QHS PRN Clapacs, Jackquline Denmark, MD       PTA Medications: Medications Prior to Admission  Medication Sig Dispense Refill Last Dose  . Cetirizine HCl (ZYRTEC ALLERGY PO) Take by mouth.     . Levonorgestrel (KYLEENA) 19.5 MG IUD 19.5 mg by Intrauterine route once for 1 dose. 1 Intra Uterine Device 0   . traZODone (DESYREL) 50 MG tablet Take 1 tablet (50 mg total) by mouth at bedtime. (Patient not taking: Reported on 06/06/2019) 30 tablet 0     Patient Stressors:    Patient Strengths:    Treatment Modalities: Medication Management, Group therapy, Case management,  1 to 1 session with clinician,  Psychoeducation, Recreational therapy.   Physician Treatment Plan for Primary Diagnosis: Bipolar 1 disorder (HCC) Long Term Goal(s): Improvement in symptoms so as ready for discharge Improvement in symptoms so as ready for discharge   Short Term Goals: Ability to verbalize feelings will improve Ability to disclose and discuss suicidal ideas Ability to demonstrate self-control will improve Ability to maintain clinical measurements within normal limits will improve Compliance with prescribed medications will improve Ability to identify triggers associated with substance abuse/mental health issues will improve  Medication Management: Evaluate patient's response, side effects, and tolerance of medication regimen.  Therapeutic Interventions: 1 to 1 sessions, Unit Group sessions and Medication administration.  Evaluation of Outcomes: Progressing  Physician Treatment Plan for Secondary Diagnosis: Principal Problem:   Bipolar 1 disorder (HCC) Active Problems:   Obsessive-compulsive disorder  Long Term Goal(s): Improvement in symptoms so as ready for discharge Improvement in symptoms so as ready for discharge   Short Term Goals: Ability to verbalize feelings will improve Ability to disclose and discuss suicidal ideas Ability to demonstrate self-control will improve Ability to maintain clinical measurements within normal limits will improve Compliance with prescribed medications will improve Ability to identify triggers associated with substance abuse/mental health issues will improve     Medication Management: Evaluate patient's response, side effects, and tolerance of medication regimen.  Therapeutic Interventions: 1 to 1  sessions, Unit Group sessions and Medication administration.  Evaluation of Outcomes: Progressing   RN Treatment Plan for Primary Diagnosis: Bipolar 1 disorder ([place]) Long Term Goal(s): Knowledge of disease and therapeutic regimen to maintain health will  improve  Short Term Goals: Ability to demonstrate self-control, Ability to participate in decision making will improve and Ability to verbalize feelings will improve  Medication Management: RN will administer medications as ordered by provider, will assess and evaluate patient's response and provide education to patient for prescribed medication. RN will report any adverse and/or side effects to prescribing provider.  Therapeutic Interventions: 1 on 1 counseling sessions, Psychoeducation, Medication administration, Evaluate responses to treatment, Monitor vital signs and CBGs as ordered, Perform/monitor CIWA, COWS, AIMS and Fall Risk screenings as ordered, Perform wound care treatments as ordered.  Evaluation of Outcomes: Progressing   LCSW Treatment Plan for Primary Diagnosis: Bipolar 1 disorder ([place]) Long Term Goal(s): Safe transition to appropriate next level of care at discharge, Engage patient in therapeutic group addressing interpersonal concerns.  Short Term Goals: Engage patient in aftercare planning with referrals and resources and Increase skills for wellness and recovery  Therapeutic Interventions: Assess for all discharge needs, 1 to 1 time with Social worker, Explore available resources and support systems, Assess for adequacy in community support network, Educate family and significant other(s) on suicide prevention, Complete Psychosocial Assessment, Interpersonal group therapy.  Evaluation of Outcomes: Progressing   Progress in Treatment: Attending groups: Yes. Participating in groups: Yes. Taking medication as prescribed: Yes. Toleration medication: Yes. Family/Significant other contact made: Yes, individual(s) contacted:  pts father Patient understands diagnosis: Yes. Discussing patient identified problems/goals with staff: Yes. Medical problems stabilized or resolved: Yes. Denies suicidal/homicidal ideation: Yes. Issues/concerns per patient self-inventory: No. Other:  N/A  New problem(s) identified: No, Describe:  none  New Short Term/Long Term Goal(s): Detox, elimination of AVH/symptoms of psychosis, medication management for mood stabilization; elimination of SI thoughts; development of comprehensive mental wellness/sobriety plan.   Patient Goals:  "I want to have a set track I can stick to"  Discharge Plan or Barriers: SPE pamphlet, Mobile Crisis information, and AA/NA information provided to patient for additional community support and resources. Pt has been referred to [facility]  Reason for Continuation of Hospitalization: Medication stabilization  Estimated Length of Stay: 3-5 days  Recreational Therapy: Patient Stressors: N/A Patient Goal: Patient will engage in groups without prompting or encouragement from LRT x3 group sessions within 5 recreation therapy group sessions  Attendees: Patient: Tina [name] 06/08/2019 11:41 AM  Physician: Dr Weber Cooks MD 06/08/2019 11:41 AM  Nursing: Collier Bullock RN 06/08/2019 11:41 AM  RN Care Manager: 06/08/2019 11:41 AM  Social Worker: Minette Brine Moton LCSW 06/08/2019 11:41 AM  Recreational Therapist: Roanna Epley CTRS LRT 06/08/2019 11:41 AM  Other: Sanjuana Kava LCSW  06/08/2019 11:41 AM  Other: Assunta Curtis LCSW 06/08/2019 11:41 AM  Other: 06/08/2019 11:41 AM    Scribe for Treatment Team: Mariann Laster Moton, LCSW 06/08/2019 11:41 AM

## 2019-06-08 NOTE — Progress Notes (Signed)
Recreation Therapy Notes  INPATIENT RECREATION THERAPY ASSESSMENT  Patient Details Name: Aveen Stansel MRN: [id number] DOB: [birth year] Today's Date: 06/08/2019       Information Obtained From: Patient  Able to Participate in Assessment/Interview: Yes  Patient Presentation: Responsive  Reason for Admission (Per Patient): Active Symptoms  Patient Stressors:    Coping Skills:   Music, Talk  Leisure Interests (2+):  Music - Listen(Skateboard,Wakeboard)  Frequency of Recreation/Participation:    Awareness of Community Resources:     [employer]:     Current Use:    If no, Barriers?:    Expressed Interest in [employer] Information:    [place] of Residence:  Film/video editor  Patient Main Form of Transportation: Set designer  Patient Strengths:  Nice  Patient Identified Areas of Improvement:  Be calm  Patient Goal for Hospitalization:  Keep my thoughts on track  Current SI (including self-harm):  No  Current HI:  No  Current AVH: No  Staff Intervention Plan: Group Attendance, Collaborate with Interdisciplinary Treatment Team  Consent to Intern Participation: N/A  Kimmberly Wisser 06/08/2019, 3:12 PM

## 2019-06-08 NOTE — Plan of Care (Signed)
D- Patient alert and oriented. Patient presents in a pleasant mood on assessment stating that she slept really well last night "I was knocked out". Patient endorsed both depression/anxiety reporting that "I just feel foggy/empty, pretty dull. I just feel like a zombie". Patient denies SI, HI, AVH, and pain at this time. Patient's goal for today is "energy level and learning how to cooperate on medication", in which she will "try my best and push myself" in order to achieve her goal.  A- Scheduled medications administered to patient, per MD orders. Support and encouragement provided.  Routine safety checks conducted every 15 minutes.  Patient informed to notify staff with problems or concerns.  R- No adverse drug reactions noted. Patient contracts for safety at this time. Patient compliant with medications and treatment plan. Patient receptive, calm, and cooperative. Patient interacts well with others on the unit.  Patient remains safe at this time.  Problem: Education: Goal: Knowledge of [place] [facility] Education information/materials will improve Outcome: Progressing Goal: Emotional status will improve Outcome: Progressing Goal: Mental status will improve Outcome: Progressing Goal: Verbalization of understanding the information provided will improve Outcome: Progressing   Problem: Activity: Goal: Interest or engagement in activities will improve Outcome: Progressing Goal: Sleeping patterns will improve Outcome: Progressing   Problem: Coping: Goal: Ability to verbalize frustrations and anger appropriately will improve Outcome: Progressing Goal: Ability to demonstrate self-control will improve Outcome: Progressing   Problem: Health Behavior/Discharge Planning: Goal: Identification of resources available to assist in meeting health care needs will improve Outcome: Progressing Goal: Compliance with treatment plan for underlying cause of condition will improve Outcome: Progressing    Problem: Physical Regulation: Goal: Ability to maintain clinical measurements within normal limits will improve Outcome: Progressing   Problem: Safety: Goal: Periods of time without injury will increase Outcome: Progressing

## 2019-06-09 NOTE — Progress Notes (Signed)
Recreation Therapy Notes   Date: 06/09/2019  Time: 9:30 am  Location: Craft Room  Behavioral response: Appropriate   Intervention Topic: Problem-Solving    Discussion/Intervention:  Group content on today was focused on problem solving. The group described what problem solving is. Patients expressed how problems affect them and how they deal with problems. Individuals identified healthy ways to deal with problems. Patients explained what normally happens to them when they do not deal with problems. The group expressed reoccurring problems for them. The group participated in the intervention "Ways to Solve problems" where patients were given a chance to explore different ways to solve problems.  Clinical Observations/Feedback:  Patient came to group and defined problem-solving as dealing with a situation in the best way. She expressed that she tries to solve problems by analyzing the situation and writing things down. Participant explained that she normally does not ask for help because she is too arrogant and think she can figure out things for herself. Individual was social with peers and staff while participant in the intervention during group.  Francis Doenges LRT/CTRS         Jonah Nestle 06/09/2019 11:32 AM

## 2019-06-09 NOTE — Plan of Care (Signed)
Patient is pleasant and cooperative on approach.Appropriate with staff & peers.Patient states "the depression is gone,the anxiety is still there."Denies SI,HI and AVH.Compliant with medications.Attended groups.Appetite and energy level good.Support and encouragement given.

## 2019-06-09 NOTE — [facility] Group Notes (Signed)
[facility] Group Notes:  (Nursing/MHT/Case Management/Adjunct)  Date:  06/09/2019  Time:  8:50 PM  Type of Therapy:  Wrap-Up Group  Participation Level:  Active  Participation Quality:  Appropriate  Affect:  Appropriate  Cognitive:  Alert and Appropriate  Insight:  Appropriate and Good  Engagement in Group:  Engaged  Modes of Intervention:  Clarification  Summary of Progress/Problems:  Uthman Mroczkowski 06/09/2019, 8:50 PM

## 2019-06-09 NOTE — [facility] Group Notes (Signed)
Balance In Life 06/09/2019 1PM  Type of Therapy/Topic:  Group Therapy:  Balance in Life  Participation Level:  Active  Description of Group:   This group will address the concept of balance and how it feels and looks when one is unbalanced. Patients will be encouraged to process areas in their lives that are out of balance and identify reasons for remaining unbalanced. Facilitators will guide patients in utilizing problem-solving interventions to address and correct the stressor making their life unbalanced. Understanding and applying boundaries will be explored and addressed for obtaining and maintaining a balanced life. Patients will be encouraged to explore ways to assertively make their unbalanced needs known to significant others in their lives, using other group members and facilitator for support and feedback.  Therapeutic Goals: 1. Patient will identify two or more emotions or situations they have that consume much of in their lives. 2. Patient will identify signs/triggers that life has become out of balance:  3. Patient will identify two ways to set boundaries in order to achieve balance in their lives:  4. Patient will demonstrate ability to communicate their needs through discussion and/or role plays  Summary of Patient Progress: Actively and appropriately engaged in the group. Patient was able to provide support and validation to other group members.Patient identified improving her diet as an area she would like to improve to find balance in life. Patient demonstrated good insight and respected boundaries during session.    Therapeutic Modalities:   Cognitive Behavioral Therapy Solution-Focused Therapy Assertiveness Training  Alaiah Lundy Philip Aspen, LCSW
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
## 2019-06-09 NOTE — Progress Notes (Signed)
Client alert and interacting in the unit. Anx is rated as a 3/10, claims "Visteril has helped; prefers in the morning admin time. Denies depression, SI,HI, AVH. Client told Clinical research associate,  I've been having racing thoughts" Client pleasant. Requested PRN trazodone for sleep.Continued q 15-min monitoring for client, Safety measures in place and effective.

## 2019-06-09 NOTE — [facility] Group Notes (Signed)
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 She is gonna reach out for extra support  Landry Mellow 06/09/2019, 3:25 AM

## 2019-06-10 MED ORDER — HYDROXYZINE HCL 25 MG PO TABS: 25.0000 mg | ORAL_TABLET | Freq: Three times a day (TID) | ORAL | 1 refills | Status: DC | PRN

## 2019-06-10 MED ORDER — TRAZODONE HCL 50 MG PO TABS: 50.0000 mg | ORAL_TABLET | Freq: Every evening | ORAL | 1 refills | Status: DC | PRN

## 2019-06-10 MED ORDER — ARIPIPRAZOLE 10 MG PO TABS: 10.0000 mg | ORAL_TABLET | Freq: Every day | ORAL | 1 refills | Status: DC

## 2019-06-10 MED ORDER — CETIRIZINE HCL 10 MG PO TABS: 10.0000 mg | ORAL_TABLET | Freq: Every day | ORAL | 1 refills | Status: AC

## 2019-06-10 NOTE — Progress Notes (Signed)
  [facility] Adult Case Management Discharge Plan :  Will you be returning to the same living situation after discharge:  Yes,  home At discharge, do you have transportation home?: Yes,  pts dad will pick her up Do you have the ability to pay for your medications: Yes,  [employer]  Release of information consent forms completed and in the chart;    Patient to Follow up at: Follow-up Information    [facility] Follow up.   Specialty: Behavioral Health Why: You have a psychiatry appoitnment on 06/17/19 at 1:30 PM with Dr. [name] and a therapy appointment on 06/18/19 at 8 AM with [name]. They are both virtual appointments. Thank You! Contact information: [address] [facility] [place] [address] [phone]          Next level of care provider has access to [facility] Link:yes  Safety Planning and Suicide Prevention discussed: Yes,  SPE completed with pts dad     Has patient been referred to the Quitline?: Patient refused referral  Patient has been referred for addiction treatment: N/A  Mechele Dawley, LCSW 06/10/2019, 10:10 AM

## 2019-06-10 NOTE — Plan of Care (Signed)
  Problem: Coping Skills Goal: STG - Patient will identify 3 positive coping skills strategies to use post d/c within 5 recreation therapy group sessions Description: STG - Patient will identify 3 positive coping skills strategies to use post d/c within 5 recreation therapy group sessions Outcome: Completed/Met
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
## 2019-06-10 NOTE — Progress Notes (Signed)
Recreation Therapy Notes  Date: 06/10/2019  Time: 9:30 am  Location: Room 21   Behavioral response: Appropriate   Intervention Topic: Animal Assisted Therapy   Discussion/Intervention:  Animal Assisted Therapy took place today during group.  Animal Assisted Therapy is the planned inclusion of an animal in a patient's treatment plan. The patients were able to engage in therapy with an animal during group. Participants were educated on what a service dog is and the different between a support dog and a service dog. Patient were informed on the many animal needs there are and how their needs are similar. Individuals were enlightened on the process to get a service animal or support animal. Patients got the opportunity to pet the animal and were offered emotional support from the animal and staff.  Clinical Observations/Feedback:  Patient came to group and was on topic and was focused on what peers and staff had to say. Participant shared their experiences and history with animals. Individual was social with peers, staff and animal while participating in group.  Edit Ricciardelli LRT/CTRS         Maelys Kinnick 06/10/2019 11:05 AM

## 2019-06-10 NOTE — BHH Suicide Risk Assessment (Signed)
[facility] Discharge Suicide Risk Assessment   Principal Problem: Bipolar 1 disorder [facility]) Discharge Diagnoses: Principal Problem:   Bipolar 1 disorder (HCC) Active Problems:   Obsessive-compulsive disorder   Total Time spent with patient: 30 minutes  Musculoskeletal: Strength & Muscle Tone: within normal limits Gait & Station: normal Patient leans: N/A  Psychiatric Specialty Exam: Review of Systems  Constitutional: Negative.   HENT: Negative.   Eyes: Negative.   Respiratory: Negative.   Cardiovascular: Negative.   Gastrointestinal: Negative.   Musculoskeletal: Negative.   Skin: Negative.   Neurological: Negative.   Psychiatric/Behavioral: Negative.     Blood pressure 109/74, pulse 95, temperature 98.5 F (36.9 C), temperature source Oral, resp. rate 16, height 5\' 2"  (1.575 m), weight 49.9 kg, SpO2 100 %.Body mass index is 20.12 kg/m.  General Appearance: Casual  Eye Contact::  Good  Speech:  Clear and Coherent409  Volume:  Normal  Mood:  Euthymic  Affect:  Congruent  Thought Process:  Goal Directed  Orientation:  Full (Time, Place, and Person)  Thought Content:  Logical  Suicidal Thoughts:  No  Homicidal Thoughts:  No  Memory:  Immediate;   Fair Recent;   Fair Remote;   Fair  Judgement:  Fair  Insight:  Fair  Psychomotor Activity:  Normal  Concentration:  Fair  Recall:  [phone] of Knowledge:Fair  Language: Fair  Akathisia:  No  Handed:  Right  AIMS (if indicated):     Assets:  Desire for Improvement Housing Physical Health Social Support  Sleep:  Number of Hours: 7.5  Cognition: WNL  ADL's:  Intact   Mental Status Per Nursing Assessment::   On Admission:  Suicidal ideation indicated by patient  Demographic Factors:  Adolescent or young adult and Caucasian  Loss Factors: Loss of significant relationship  Historical Factors: Impulsivity  Risk Reduction Factors:   Living with another person, especially a relative, Positive social support and  Positive therapeutic relationship  Continued Clinical Symptoms:  Bipolar Disorder:   Mixed State  Cognitive Features That Contribute To Risk:  None    Suicide Risk:  Minimal: No identifiable suicidal ideation.  Patients presenting with no risk factors but with morbid ruminations; may be classified as minimal risk based on the severity of the depressive symptoms  Follow-up Information    [facility] Follow up.   Specialty: Behavioral Health Why: You have a psychiatry appoitnment on 06/17/19 at 1:30 PM with Dr. 08/17/19 and a therapy appointment on 06/18/19 at 8 AM with 08/18/19. They are both virtual appointments. Thank You! Contact information: [name] [facility] [place] Be[place] [phone]          Plan Of Care/Follow-up recommendations:  Activity:  Activity as tolerated Diet:  Follow-up with outpatient treatment as noted previously Other:  Regular diet.  [phone], MD 06/10/2019, 10:38 AM

## 2019-06-10 NOTE — Progress Notes (Signed)
DISCHARGE NOTE:  Tina [name] left the unit with transportation provided by the patient's friend.  Belongings were verified and returned, follow-up appointments were discussed, and the patient agreed to call the unit should questions arise.  [name] denied thoughts of harming herself and others and provided evidence of future orientation.  The patient's mood was stable and emotions were well-managed prior to discharge.  [name] stated, "I definitely will get online for this appointment you set."  The patient was walked to the lobby where she met her transportation.

## 2019-06-10 NOTE — Plan of Care (Signed)
Patient endorsed being ready for discharge.   Problem: Education: Goal: Mental status will improve Outcome: Progressing   Problem: Coping: Goal: Ability to verbalize frustrations and anger appropriately will improve Outcome: Progressing

## 2019-06-10 NOTE — BHH Group Notes (Signed)
LCSW Group Therapy Note  06/10/2019 2:27 PM  Type of Therapy/Topic:  Group Therapy:  Feelings about Diagnosis  Participation Level:  Did Not Attend   Description of Group:   This group will allow patients to explore their thoughts and feelings about diagnoses they have received. Patients will be guided to explore their level of understanding and acceptance of these diagnoses. Facilitator will encourage patients to process their thoughts and feelings about the reactions of others to their diagnosis and will guide patients in identifying ways to discuss their diagnosis with significant others in their lives. This group will be process-oriented, with patients participating in exploration of their own experiences, giving and receiving support, and processing challenge from other group members.   Therapeutic Goals: 1. Patient will demonstrate understanding of diagnosis as evidenced by identifying two or more symptoms of the disorder 2. Patient will be able to express two feelings regarding the diagnosis 3. Patient will demonstrate their ability to communicate their needs through discussion and/or role play  Summary of Patient Progress: X  Therapeutic Modalities:   Cognitive Behavioral Therapy Brief Therapy Feelings Identification   Penni Homans, MSW, LCSW 06/10/2019 2:27 PM

## 2019-06-10 NOTE — Progress Notes (Signed)
Recreation Therapy Notes  INPATIENT RECREATION TR PLAN  Patient Details Name: Tina [name] MRN: [id number] DOB: 10/26/2000 Today's Date: 06/10/2019  Rec Therapy Plan Is patient appropriate for Therapeutic Recreation?: Yes Treatment times per week: at least 3 Estimated Length of Stay: 5-7 days TR Treatment/Interventions: Group participation (Comment)  Discharge Criteria Pt will be discharged from therapy if:: Discharged Treatment plan/goals/alternatives discussed and agreed upon by:: Patient/family  Discharge Summary Short term goals set: Patient will identify 3 positive coping skills strategies to use post d/c within 5 recreation therapy group sessions Short term goals met: Complete Progress toward goals comments: Groups attended Which groups?: AAA/T, Coping skills, Other (Comment)(Problem Solving, Self-care) Reason goals not met: N/A Therapeutic equipment acquired: N/A Reason patient discharged from therapy: Discharge from hospital Pt/family agrees with progress & goals achieved: Yes Date patient discharged from therapy: 06/10/19      06/10/2019, 11:42 AM  

## 2019-06-15 ENCOUNTER — Ambulatory Visit (HOSPITAL_COMMUNITY): Payer: 59 | Admitting: Professional

## 2019-06-15 ENCOUNTER — Other Ambulatory Visit: Payer: Self-pay

## 2019-06-15 DIAGNOSIS — F429 Obsessive-compulsive disorder, unspecified: Secondary | ICD-10-CM

## 2019-06-16 NOTE — Psych (Signed)
Virtual Visit via Telephone Note  I connected with Tina [name] on 06/15/19 at  2:30 PM EDT by telephone and verified that I am speaking with the correct person using two identifiers.   I discussed the limitations, risks, security and privacy concerns of performing an evaluation and management service by telephone and the availability of in person appointments. I also discussed with the patient that there may be a patient responsible charge related to this service. The patient expressed understanding and agreed to proceed.  Follow Up Instructions: I discussed the assessment and treatment plan with the patient. The patient was provided an opportunity to ask questions and all were answered. The patient agreed with the plan and demonstrated an understanding of the instructions.   The patient was advised to call back or seek an in-person evaluation if the symptoms worsen or if the condition fails to improve as anticipated.  I provided 15 minutes of non-face-to-face time during this encounter.   [name], LCMHCA      Cln attempted to complete assessment via WebEx video. Pt's connection was not working well so pt requested cln call cell to complete. Cln agreed.  Pt reported for PHP assessment per inpatient follow-up. Cln oriented pt to PHP. Pt reports she is not interested at this time due to "feeling good. I think I have a decent handle on it (symptoms) and I am coping will. I do my own version of psychotherapy in my journal and feel I'm going at a good pace." Pt reports she has an appointment scheduled with counselor, Tina [name], and psychiatrist, Dr. Jerold [name], this Thursday and Friday. When cln enters EPIC to confirm, both appointments have been cancelled. Cln provides pt with [facility] number to call and reschedule appointments and will also reach out to [facility], referral coordinator, for help with appointments. Pt denies SI/HI/AVH at this time.

## 2019-06-17 ENCOUNTER — Ambulatory Visit: Payer: 59 | Admitting: Child and Adolescent Psychiatry

## 2019-06-18 ENCOUNTER — Ambulatory Visit: Payer: 59 | Admitting: Licensed Clinical Social Worker
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
## 2019-08-17 ENCOUNTER — Other Ambulatory Visit (HOSPITAL_COMMUNITY)
Admission: RE | Admit: 2019-08-17 | Discharge: 2019-08-17 | Disposition: A | Discharge status: Home / Self Care | Payer: Managed Care, Other (non HMO) | Source: Ambulatory Visit | Attending: Obstetrics and Gynecology | Admitting: Obstetrics and Gynecology

## 2019-08-17 ENCOUNTER — Encounter: Payer: Self-pay | Admitting: Obstetrics and Gynecology

## 2019-08-17 ENCOUNTER — Other Ambulatory Visit: Payer: Self-pay

## 2019-08-17 ENCOUNTER — Ambulatory Visit (INDEPENDENT_AMBULATORY_CARE_PROVIDER_SITE_OTHER): Payer: Managed Care, Other (non HMO) | Admitting: Obstetrics and Gynecology

## 2019-08-17 VITALS — BP 100/70 | Ht 62.0 in | Wt 115.0 lb

## 2019-08-17 DIAGNOSIS — Z30431 Encounter for routine checking of intrauterine contraceptive device: Secondary | ICD-10-CM | POA: Diagnosis not present

## 2019-08-17 DIAGNOSIS — Z113 Encounter for screening for infections with a predominantly sexual mode of transmission: Secondary | ICD-10-CM | POA: Diagnosis present

## 2019-08-17 DIAGNOSIS — Z01419 Encounter for gynecological examination (general) (routine) without abnormal findings: Secondary | ICD-10-CM

## 2019-08-17 NOTE — Patient Instructions (Signed)
I value your feedback and entrusting us with your care. If you get a [facility] patient survey, I would appreciate you taking the time to let us know about your experience today. Thank you!  As of February 18, 2019, your lab results will be released to your MyChart immediately, before I even have a chance to see them. Please give me time to review them and contact you if there are any abnormalities. Thank you for your patience.  

## 2019-08-18 LAB — CERVICOVAGINAL ANCILLARY ONLY
Chlamydia: NEGATIVE
Comment: NEGATIVE
Comment: NORMAL
Neisseria Gonorrhea: NEGATIVE

## 2019-09-07 ENCOUNTER — Other Ambulatory Visit: Payer: Self-pay | Admitting: Neurology

## 2019-09-07 DIAGNOSIS — G43919 Migraine, unspecified, intractable, without status migrainosus: Secondary | ICD-10-CM

## 2019-11-05 ENCOUNTER — Ambulatory Visit
Admission: RE | Admit: 2019-11-05 | Discharge: 2019-11-05 | Disposition: A | Discharge status: Home / Self Care | Payer: Managed Care, Other (non HMO) | Source: Ambulatory Visit | Attending: Neurology | Admitting: Neurology

## 2019-11-05 ENCOUNTER — Other Ambulatory Visit: Payer: Self-pay

## 2019-11-05 DIAGNOSIS — G43919 Migraine, unspecified, intractable, without status migrainosus: Secondary | ICD-10-CM | POA: Insufficient documentation

## 2019-11-05 MED ORDER — GADOBUTROL 1 MMOL/ML IV SOLN
5.0000 mL | Freq: Once | INTRAVENOUS | Status: AC | PRN
  Administered 2019-11-05: 5 mL via INTRAVENOUS

## 2019-11-10 DIAGNOSIS — R768 Other specified abnormal immunological findings in serum: Secondary | ICD-10-CM

## 2019-11-10 HISTORY — DX: Other specified abnormal immunological findings in serum: R76.8

## 2019-11-24 ENCOUNTER — Ambulatory Visit (INDEPENDENT_AMBULATORY_CARE_PROVIDER_SITE_OTHER): Payer: Managed Care, Other (non HMO) | Admitting: Obstetrics and Gynecology

## 2019-11-24 ENCOUNTER — Other Ambulatory Visit: Payer: Self-pay

## 2019-11-24 ENCOUNTER — Encounter: Payer: Self-pay | Admitting: Obstetrics and Gynecology

## 2019-11-24 VITALS — BP 96/60 | Ht 62.0 in | Wt 121.0 lb

## 2019-11-24 DIAGNOSIS — Z113 Encounter for screening for infections with a predominantly sexual mode of transmission: Secondary | ICD-10-CM | POA: Diagnosis not present

## 2019-11-24 DIAGNOSIS — Z202 Contact with and (suspected) exposure to infections with a predominantly sexual mode of transmission: Secondary | ICD-10-CM | POA: Diagnosis not present

## 2019-11-24 DIAGNOSIS — N3001 Acute cystitis with hematuria: Secondary | ICD-10-CM | POA: Insufficient documentation

## 2019-11-24 HISTORY — DX: Acute cystitis with hematuria: N30.01

## 2019-11-24 LAB — POCT URINALYSIS DIPSTICK
Bilirubin, UA: NEGATIVE
Glucose, UA: NEGATIVE
Ketones, UA: NEGATIVE
Nitrite, UA: POSITIVE
Protein, UA: NEGATIVE
Spec Grav, UA: 1.02 (ref 1.010–1.025)
pH, UA: 6 (ref 5.0–8.0)

## 2019-11-24 MED ORDER — NITROFURANTOIN MONOHYD MACRO 100 MG PO CAPS: 100.0000 mg | ORAL_CAPSULE | Freq: Two times a day (BID) | ORAL | 0 refills | Status: AC
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
## 2019-11-24 NOTE — Patient Instructions (Signed)
I value your feedback and entrusting us with your care. If you get a [facility] patient survey, I would appreciate you taking the time to let us know about your experience today. Thank you!  As of February 18, 2019, your lab results will be released to your MyChart immediately, before I even have a chance to see them. Please give me time to review them and contact you if there are any abnormalities. Thank you for your patience.  

## 2019-11-25 LAB — HSV 2 ANTIBODY, IGG: HSV 2 IgG, Type Spec: 5.5 index — ABNORMAL HIGH (ref 0.00–0.90)

## 2019-11-27 LAB — CHLAMYDIA/GONOCOCCUS/TRICHOMONAS, NAA
Chlamydia by NAA: NEGATIVE
Gonococcus by NAA: NEGATIVE
Trich vag by NAA: NEGATIVE

## 2020-08-17 ENCOUNTER — Ambulatory Visit: Payer: Managed Care, Other (non HMO) | Admitting: Obstetrics and Gynecology

## 2020-09-04 ENCOUNTER — Encounter: Payer: Self-pay | Admitting: Obstetrics and Gynecology

## 2020-09-04 ENCOUNTER — Other Ambulatory Visit: Payer: Self-pay

## 2020-09-04 ENCOUNTER — Ambulatory Visit (INDEPENDENT_AMBULATORY_CARE_PROVIDER_SITE_OTHER): Payer: Managed Care, Other (non HMO) | Admitting: Obstetrics and Gynecology

## 2020-09-04 VITALS — BP 100/70 | Ht 62.0 in | Wt 118.0 lb

## 2020-09-04 DIAGNOSIS — Z30431 Encounter for routine checking of intrauterine contraceptive device: Secondary | ICD-10-CM | POA: Diagnosis not present

## 2020-09-04 DIAGNOSIS — Z113 Encounter for screening for infections with a predominantly sexual mode of transmission: Secondary | ICD-10-CM

## 2020-09-04 DIAGNOSIS — Z01419 Encounter for gynecological examination (general) (routine) without abnormal findings: Secondary | ICD-10-CM | POA: Diagnosis not present
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
## 2020-09-04 NOTE — Patient Instructions (Signed)
I value your feedback and you entrusting us with your care. If you get a [facility] patient survey, I would appreciate you taking the time to let us know about your experience today. Thank you! ? ? ?

## 2020-09-07 LAB — CHLAMYDIA/GONOCOCCUS/TRICHOMONAS, NAA
Chlamydia by NAA: NEGATIVE
Gonococcus by NAA: NEGATIVE
Trich vag by NAA: NEGATIVE

## 2020-10-23 ENCOUNTER — Encounter: Payer: Self-pay | Admitting: Obstetrics and Gynecology

## 2020-10-23 ENCOUNTER — Telehealth: Payer: Self-pay | Admitting: Obstetrics and Gynecology

## 2020-10-23 MED ORDER — VALACYCLOVIR HCL 500 MG PO TABS: 500.0000 mg | ORAL_TABLET | Freq: Every day | ORAL | 3 refills | Status: DC

## 2020-10-23 NOTE — Telephone Encounter (Addendum)
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 Has new partner and wants daily valtrex to reduce possible transmission. Rx eRxd. F/u prn

## 2020-12-28 ENCOUNTER — Other Ambulatory Visit: Payer: Self-pay

## 2020-12-28 ENCOUNTER — Emergency Department
Admission: EM | Admit: 2020-12-28 | Discharge: 2020-12-29 | Disposition: A | Discharge status: Other Acute Inpatient Hospital | Payer: Managed Care, Other (non HMO) | Attending: Emergency Medicine | Admitting: Emergency Medicine

## 2020-12-28 DIAGNOSIS — Z91010 Allergy to peanuts: Secondary | ICD-10-CM | POA: Insufficient documentation

## 2020-12-28 DIAGNOSIS — R059 Cough, unspecified: Secondary | ICD-10-CM | POA: Diagnosis not present

## 2020-12-28 DIAGNOSIS — F39 Unspecified mood [affective] disorder: Secondary | ICD-10-CM | POA: Diagnosis not present

## 2020-12-28 DIAGNOSIS — F29 Unspecified psychosis not due to a substance or known physiological condition: Secondary | ICD-10-CM | POA: Insufficient documentation

## 2020-12-28 DIAGNOSIS — Z87891 Personal history of nicotine dependence: Secondary | ICD-10-CM | POA: Insufficient documentation

## 2020-12-28 DIAGNOSIS — F329 Major depressive disorder, single episode, unspecified: Secondary | ICD-10-CM | POA: Diagnosis not present

## 2020-12-28 DIAGNOSIS — F32A Depression, unspecified: Secondary | ICD-10-CM | POA: Diagnosis present

## 2020-12-28 DIAGNOSIS — Z20822 Contact with and (suspected) exposure to covid-19: Secondary | ICD-10-CM | POA: Diagnosis not present

## 2020-12-28 DIAGNOSIS — F99 Mental disorder, not otherwise specified: Secondary | ICD-10-CM

## 2020-12-28 DIAGNOSIS — R0981 Nasal congestion: Secondary | ICD-10-CM | POA: Insufficient documentation

## 2020-12-28 DIAGNOSIS — Z79899 Other long term (current) drug therapy: Secondary | ICD-10-CM | POA: Diagnosis not present

## 2020-12-28 LAB — COMPREHENSIVE METABOLIC PANEL
ALT: 14 U/L (ref 0–44)
AST: 18 U/L (ref 15–41)
Albumin: 4.3 g/dL (ref 3.5–5.0)
Alkaline Phosphatase: 58 U/L (ref 38–126)
Anion gap: 9 (ref 5–15)
BUN: 11 mg/dL (ref 6–20)
CO2: 24 mmol/L (ref 22–32)
Calcium: 8.9 mg/dL (ref 8.9–10.3)
Chloride: 107 mmol/L (ref 98–111)
Creatinine, Ser: 0.61 mg/dL (ref 0.44–1.00)
GFR, Estimated: >60 mL/min (ref >60)
Glucose, Bld: 92 mg/dL (ref 70–99)
Potassium: 3.8 mmol/L (ref 3.5–5.1)
Sodium: 140 mmol/L (ref 135–145)
Total Bilirubin: 0.3 mg/dL (ref 0.3–1.2)
Total Protein: 7.7 g/dL (ref 6.5–8.1)

## 2020-12-28 LAB — URINE DRUG SCREEN, QUALITATIVE (ARMC ONLY)
Amphetamines, Ur Screen: NOT DETECTED
Barbiturates, Ur Screen: NOT DETECTED
Benzodiazepine, Ur Scrn: NOT DETECTED
Cannabinoid 50 Ng, Ur ~~LOC~~: POSITIVE — AB
Cocaine Metabolite,Ur ~~LOC~~: NOT DETECTED
MDMA (Ecstasy)Ur Screen: NOT DETECTED
Methadone Scn, Ur: NOT DETECTED
Opiate, Ur Screen: NOT DETECTED
Phencyclidine (PCP) Ur S: NOT DETECTED
Tricyclic, Ur Screen: NOT DETECTED

## 2020-12-28 LAB — CBC
HCT: 39.1 % (ref 36.0–46.0)
Hemoglobin: 12.8 g/dL (ref 12.0–15.0)
MCH: 28.1 pg (ref 26.0–34.0)
MCHC: 32.7 g/dL (ref 30.0–36.0)
MCV: 85.7 fL (ref 80.0–100.0)
Platelets: 242 10*3/uL (ref 150–400)
RBC: 4.56 MIL/uL (ref 3.87–5.11)
RDW: 13.6 % (ref 11.5–15.5)
WBC: 6.4 10*3/uL (ref 4.0–10.5)
nRBC: 0 % (ref 0.0–0.2)

## 2020-12-28 LAB — ACETAMINOPHEN LEVEL: Acetaminophen (Tylenol), Serum: <10 ug/mL — ABNORMAL LOW (ref 10–30)

## 2020-12-28 LAB — RESP PANEL BY RT-PCR (FLU A&B, COVID) ARPGX2
Influenza A by PCR: NEGATIVE
Influenza B by PCR: NEGATIVE
SARS Coronavirus 2 by RT PCR: NEGATIVE

## 2020-12-28 LAB — SALICYLATE LEVEL: Salicylate Lvl: <7 mg/dL — ABNORMAL LOW (ref 7.0–30.0)

## 2020-12-28 LAB — ETHANOL: Alcohol, Ethyl (B): <10 mg/dL (ref <10)

## 2020-12-28 LAB — POC URINE PREG, ED: Preg Test, Ur: NEGATIVE

## 2020-12-28 MED ORDER — HYDROXYZINE HCL 25 MG PO TABS
25.0000 mg | ORAL_TABLET | Freq: Three times a day (TID) | ORAL | Status: DC | PRN
  Administered 2020-12-28: 25 mg via ORAL
  Filled 2020-12-28: qty 1

## 2020-12-28 MED ORDER — TRAZODONE HCL 100 MG PO TABS: 50.0000 mg | ORAL_TABLET | Freq: Every evening | ORAL | Status: DC | PRN

## 2020-12-28 NOTE — ED Notes (Signed)
Pt verbalized she is getting anxious and is having increasing paranoia. Pt requesting something to help calm her. Pt remains calm and cooperative with this RN. Pt declined offer of food/drink. Provider Derrill Kay notified.
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
## 2020-12-28 NOTE — ED Notes (Signed)
Report received from [place], English as a second language teacher. Patient alert and oriented, warm and dry, and in no acute distress. Patient denies SI (currently), HI, AVH and pain. Patient made aware of Q15 minute rounds and Psychologist, counselling presence for their safety. Patient instructed to come to this nurse with needs or concerns.
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
## 2020-12-28 NOTE — ED Notes (Signed)
Pt belongings:  1 brown sweater 1 pair of jeans 1 pair of black sneakers (laces in cup) 1 black hair tie 1 black bra and black underwear 1 pair of black leggings and socks 1 cup of piercings

## 2020-12-28 NOTE — ED Notes (Signed)
Pt refused snack 

## 2020-12-28 NOTE — ED Notes (Signed)
Pt. To BHU from ED ambulatory without difficulty, to room  BHU 3. Report from Chris RN. Pt. Is alert and oriented, warm and dry in no distress. Pt. Denies SI, HI, and AVH. Pt. Calm and cooperative. Pt. Made aware of security cameras and Q15 minute rounds. Pt. Encouraged to let Nursing staff know of any concerns or needs.   ENVIRONMENTAL ASSESSMENT Potentially harmful objects out of patient reach: Yes.   Personal belongings secured: Yes.   Patient dressed in hospital provided attire only: Yes.   Plastic bags out of patient reach: Yes.   Patient care equipment (cords, cables, call bells, lines, and drains) shortened, removed, or accounted for: Yes.   Equipment and supplies removed from bottom of stretcher: Yes.   Potentially toxic materials out of patient reach: Yes.   Sharps container removed or out of patient reach: Yes.    

## 2020-12-28 NOTE — ED Notes (Signed)
Staff member at bedside.

## 2020-12-28 NOTE — BH Assessment (Signed)
Patient has been accepted to [facility].  Accepting physician is Dr. Estill Cotta.  Call report to [phone].  Representative was [place].   ER Staff is aware of it:  Rivka Barbara, ER Secretary  Dr. Katrinka Blazing, ER MD  Selena Batten, Patient's Nurse     Pt can be transported anytime after 8 am 12/29/20.

## 2020-12-28 NOTE — ED Triage Notes (Signed)
Pt here after running out of her depression medication about 2 months ago. Pt states that she feels like she's "in a constant state of panic". Pt endorses suicidal thoughts. Pt has a plan to commit suicide. Pt crying in triage.

## 2020-12-28 NOTE — ED Notes (Signed)
Report to Selena Batten, Charity fundraiser. Pt to move to [facility]

## 2020-12-28 NOTE — ED Notes (Addendum)
Pt refused to have Resp panel swab completed at this time. Pt verbalized she wants to call family and leave; EDP Derrill Kay notified in person.

## 2020-12-28 NOTE — BH Assessment (Signed)
Referral information for Psychiatric Hospitalization faxed to:  [facility] ([phone]-or- [phone]),   [facility] ([phone]---[phone]),  [facility] ([phone]),   [facility] ([phone] -or- [phone]),   [facility] ([phone]).  [facility] ([phone])  
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
## 2020-12-28 NOTE — ED Notes (Signed)
Pt refuses COVID swab at this time. States because she has to stay she refuses treatment.

## 2020-12-28 NOTE — BH Assessment (Signed)
Comprehensive Clinical Assessment (CCA) Note  12/28/2020 [facility] [place] [id number]  Chief Complaint:  Chief Complaint  Patient presents with   Psychiatric Evaluation   Visit Diagnosis: Major Depression   Tina [name] is a 20 year old female who present to the ER due to anxiety and thoughts of ending her life. She states she was doing well until about March. That is when she started to noticed her medications were not as effective. Her provider changes the medications approximately two months ago to [place] and it made her symptoms worse. Approximately a month ago she stops taking the medications. Since then, she's more depressed and thoughts of ending her life via overdosing.  During the interview the patient was calm, cooperative and pleasant. She was able to provide appropriate answers, she denies HI and AV/H. She admits to the use of cannabis and it was last used 12/27/2020.  CCA Screening, Triage and Referral (STR)  Patient Reported Information How did you hear about [place]? Self  What Is the Reason for Your Visit/Call Today? Patient came to the ER to get help because of thoughts of ending her life.  How Long Has This Been Causing You Problems? 1 wk - 1 month  What Do You Feel Would Help You the Most Today? Treatment for Depression or other mood problem   Have You Recently Had Any Thoughts About Hurting Yourself? Yes  Are You Planning to Commit Suicide/Harm Yourself At This time? Yes   Have you Recently Had Thoughts About Hurting Someone Karolee Ohs? No  Are You Planning to Harm Someone at This Time? No  Explanation: No data recorded  Have You Used Any Alcohol or Drugs in the Past 24 Hours? Yes  How Long Ago Did You Use Drugs or Alcohol? No data recorded What Did You Use and How Much? THC   Do You Currently Have a Therapist/Psychiatrist? No  Name of Therapist/Psychiatrist: No data recorded  Have You Been Recently Discharged From Any Office Practice or Programs? No  Explanation  of Discharge From Practice/Program: No data recorded    CCA Screening Triage Referral Assessment Type of Contact: Face-to-Face  Telemedicine Service Delivery:   Is this Initial or Reassessment? No data recorded Date Telepsych consult ordered in [facility]:  No data recorded Time Telepsych consult ordered in [facility]:  No data recorded Location of Assessment: [facility] ED  Provider Location: [facility] ED   Collateral Involvement: No data recorded  Does Patient Have a Court Appointed Legal Guardian? No data recorded Name and Contact of Legal Guardian: No data recorded If Minor and Not Living with Parent(s), Who has Custody? No data recorded Is CPS involved or ever been involved? Never  Is APS involved or ever been involved? Never   Patient Determined To Be At Risk for Harm To Self or Others Based on Review of Patient Reported Information or Presenting Complaint? No  Method: No data recorded Availability of Means: No data recorded Intent: No data recorded Notification Required: No data recorded Additional Information for Danger to Others Potential: No data recorded Additional Comments for Danger to Others Potential: No data recorded Are There Guns or Other Weapons in Your Home? No data recorded Types of Guns/Weapons: No data recorded Are These Weapons Safely Secured?                            No data recorded Who Could Verify You Are Able To Have These Secured: No data recorded Do You Have any Outstanding Charges,  Pending Court Dates, Parole/Probation? No data recorded Contacted To Inform of Risk of Harm To Self or Others: No data recorded   Does Patient Present under Involuntary Commitment? Yes  IVC Papers Initial File Date: 12/28/20   [place] of Residence: [place]   Patient Currently Receiving the Following Services: Not Receiving Services   Determination of Need: Emergent (2 hours)   Options For Referral: Inpatient Hospitalization    CCA Biopsychosocial Patient Reported  Schizophrenia/Schizoaffective Diagnosis in Past: No   Strengths: Have some insight   Mental Health Symptoms Depression:   Change in energy/activity; Hopelessness; Sleep (too much or little); Worthlessness   Duration of Depressive symptoms:  Duration of Depressive Symptoms: Greater than two weeks   Mania:   N/A   Anxiety:    N/A   Psychosis:   None   Duration of Psychotic symptoms:    Trauma:   N/A   Obsessions:   N/A   Compulsions:   N/A   Inattention:   N/A   Hyperactivity/Impulsivity:   N/A   Oppositional/Defiant Behaviors:   N/A   Emotional Irregularity:   Intense/inappropriate anger; Intense/unstable relationships   Other Mood/Personality Symptoms:  No data recorded   Mental Status Exam Appearance and self-care  Stature:   Average   Weight:   Average weight   Clothing:   Neat/clean; Age-appropriate   Grooming:   Normal   Cosmetic use:   Age appropriate   Posture/gait:   Normal   Motor activity:   -- (Within normal range)   Sensorium  Attention:   Normal   Concentration:   Normal   Orientation:   X5   Recall/memory:   Normal   Affect and Mood  Affect:   Appropriate; Full Range   Mood:   Depressed; Anxious   Relating  Eye contact:   Normal   Facial expression:   Responsive   Attitude toward examiner:   Cooperative   Thought and Language  Speech flow:  Clear and Coherent   Thought content:   Appropriate to Mood and Circumstances   Preoccupation:   Ruminations   Hallucinations:   None   Organization:  No data recorded  [employer] of Knowledge:   Average   Intelligence:   Average   Abstraction:   Normal   Judgement:   Normal   Reality Testing:   Adequate   Insight:   Poor   Decision Making:   Normal   Social Functioning  Social Maturity:   Responsible   Social Judgement:   Normal   Stress  Stressors:   Relationship   Coping Ability:   Normal   Skill  Deficits:   None   Supports:  No data recorded    Religion: Religion/Spirituality Are You A Religious Person?: No  Leisure/Recreation: Leisure / Recreation Do You Have Hobbies?: No  Exercise/Diet: Exercise/Diet Do You Exercise?: No Have You Gained or Lost A Significant Amount of Weight in the Past Six Months?: No Do You Follow a Special Diet?: No Do You Have Any Trouble Sleeping?: No   CCA Employment/Education Employment/Work Situation: Employment / Work Situation Employment Situation: Unemployed Patient's Job has Been Impacted by Current Illness: No Has Patient ever Been in Equities trader?: No  Education: Education Is Patient Currently Attending School?: No Did Theme park manager?: No Did You Have An Individualized Education Program (IIEP): No Did You Have Any Difficulty At [employer]?: No Patient's Education Has Been Impacted by Current Illness: No   CCA Family/Childhood History Family  and Relationship History: Family history Marital status: Single Does patient have children?: No  Childhood History:  Childhood History By whom was/is the patient raised?: Mother Did patient suffer any verbal/emotional/physical/sexual abuse as a child?: No Did patient suffer from severe childhood neglect?: No Has patient ever been sexually abused/assaulted/raped as an adolescent or adult?: No Was the patient ever a victim of a crime or a disaster?: No Witnessed domestic violence?: No Has patient been affected by domestic violence as an adult?: No  Child/Adolescent Assessment:     CCA Substance Use Alcohol/Drug Use: Alcohol / Drug Use Pain Medications: See PTA Prescriptions: See PTA Over the Counter: See PTA History of alcohol / drug use?: Yes Longest period of sobriety (when/how long): Unable to quantify Substance #1 Name of Substance 1: Cannabis 1 - Last Use / Amount: 12/27/2020   ASAM's:  Six Dimensions of Multidimensional Assessment  Dimension 1:  Acute  Intoxication and/or Withdrawal Potential:      Dimension 2:  Biomedical Conditions and Complications:      Dimension 3:  Emotional, Behavioral, or Cognitive Conditions and Complications:     Dimension 4:  Readiness to Change:     Dimension 5:  Relapse, Continued use, or Continued Problem Potential:     Dimension 6:  Recovery/Living Environment:     ASAM Severity Score:    ASAM Recommended Level of Treatment:     Substance use Disorder (SUD)    Recommendations for Services/Supports/Treatments:    Discharge Disposition:    DSM5 Diagnoses: Patient Active Problem List   Diagnosis Date Noted   Acute cystitis with hematuria 11/24/2019   Exposure to genital herpes 11/24/2019   Bipolar 1 disorder (HCC) 06/07/2019   Other insomnia 04/09/2018   Obsessive-compulsive disorder 02/26/2018   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

## 2020-12-28 NOTE — ED Notes (Signed)
IVC  PENDING  PLACEMENT  CONSULT  DONE 

## 2020-12-29 NOTE — ED Notes (Signed)
Report called in to [facility]. Spoke with Lyla Son, Charity fundraiser. Awaiting transportation.

## 2020-12-29 NOTE — ED Notes (Signed)
IVC/ Accepted to [facility] after 8 am  

## 2020-12-29 NOTE — ED Notes (Signed)
Pt going to [facility]. Escorted by Emergency planning/management officer, given all her personal belongings. Stable condition, ambulatory with steady gait and in NAD.

## 2020-12-29 NOTE — ED Notes (Signed)
EMTALA reviewed by this RN.  

## 2020-12-29 NOTE — ED Notes (Signed)
[place]  [employer]  [employer]  CALLED  FOR  TRANSPORT TO  [facility] 

## 2021-01-01 IMAGING — MR MR HEAD WO/W CM
15 series · 48 of 48 positions shown · IV contrast (gadavist)
Comparison: None.

CLINICAL DATA: Migraine with complex Deichmann

EXAM:
MRI HEAD WITHOUT AND WITH CONTRAST
TECHNIQUE: Multiplanar, multiecho pulse sequences of the brain and surrounding
structures were obtained without and with intravenous contrast.
CONTRAST:  5mL GADAVIST

[Series 5: ax dwi_tracew · axial · 3.0mm · 0.60mm/px · z∈[-107,+45]mm · 2 of 48 slices shown]
[im 1/48]
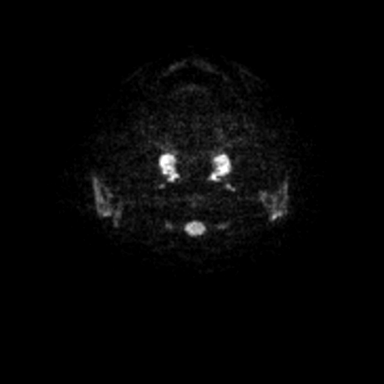
[im 48/48]
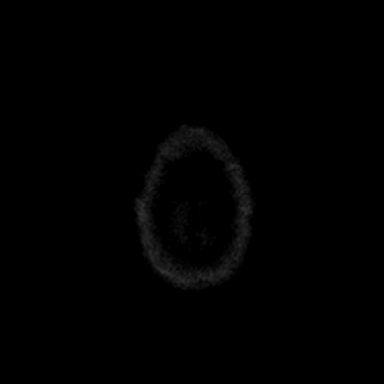

[Series 6: ax dwi_adc · axial · 3.0mm · 0.60mm/px · z∈[-107,+45]mm · 3 of 48 slices shown]
[im 1/48]
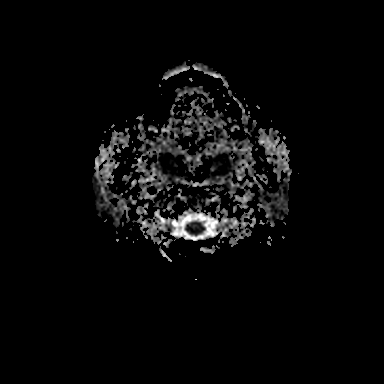
[im 24/48]
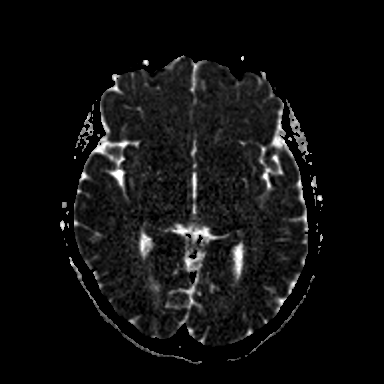
[im 48/48]
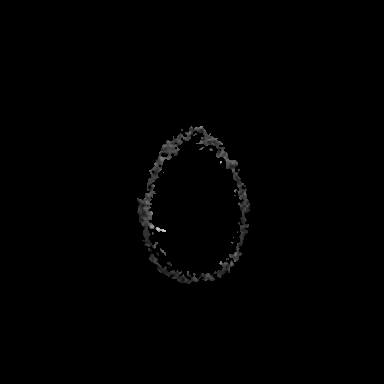

[Series 7: cor dwi_tracew · coronal · 5.0mm · 0.60mm/px · 2 of 34 slices shown]
[im 1/34]
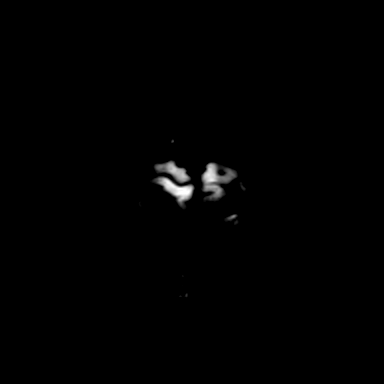
[im 34/34]
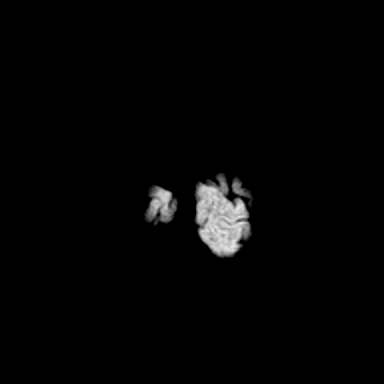

[Series 8: cor dwi_adc · coronal · 5.0mm · 0.60mm/px · 2 of 34 slices shown]
[im 1/34]
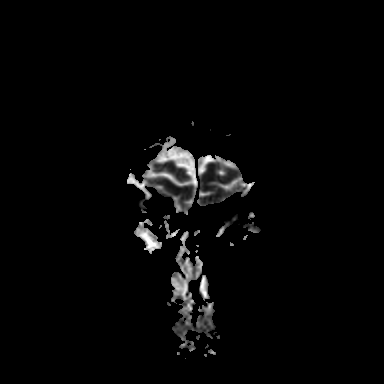
[im 34/34]
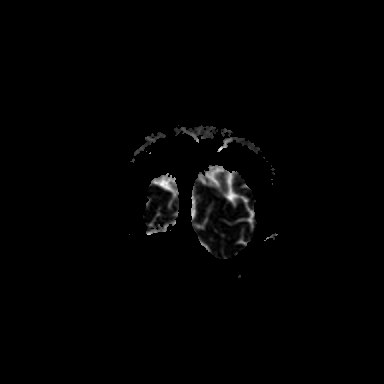

[Series 9: T1 · sagittal · 5.0mm · 0.62mm/px · 1 of 21 slices shown (1 of 2)]
[im 1/21]
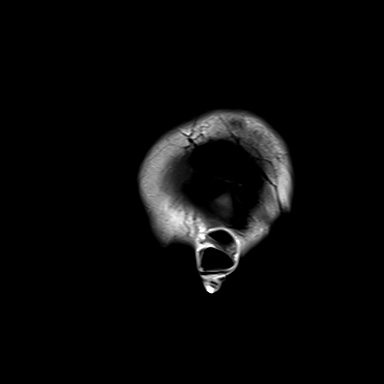

[Series 10: T2 · axial · 5.0mm · 0.53mm/px · 1 of 25 slices shown]
[im 1/25]
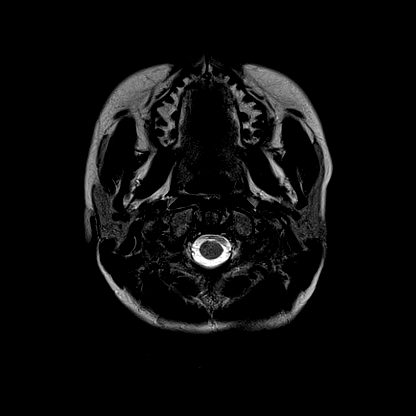

[Series 11: mag_images · axial · 3.0mm · 0.90mm/px · z∈[-118,+56]mm · 3 of 60 slices shown]
[im 1/60]
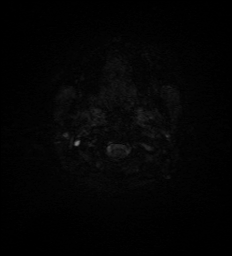
[im 30/60]
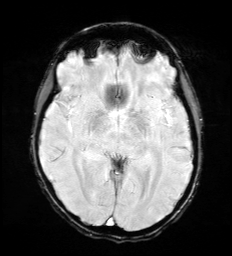
[im 60/60]
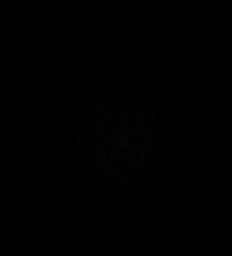

[Series 12: pha_images · axial · 3.0mm · 0.90mm/px · z∈[-118,+53]mm · 3 of 59 slices shown]
[im 1/59]
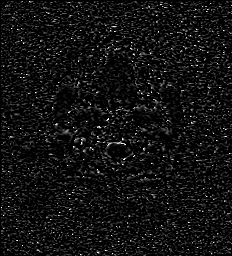
[im 30/59]
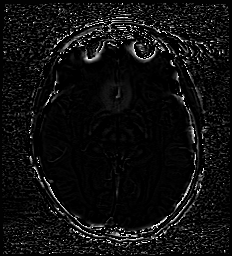
[im 59/59]
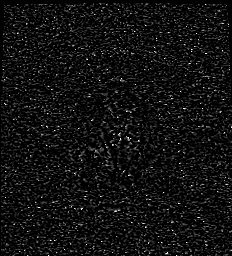

[Series 13: swi_images · axial · 3.0mm · 0.90mm/px · z∈[-118,+56]mm · 3 of 60 slices shown]
[im 1/60]
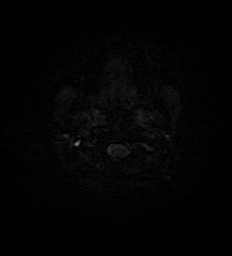
[im 30/60]
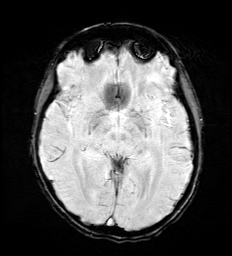
[im 60/60]
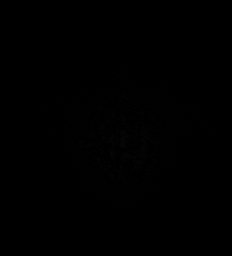

[Series 15: FLAIR · axial · 3.0mm · 0.53mm/px · z∈[-110,+49]mm · 3 of 55 slices shown]
[im 1/55]
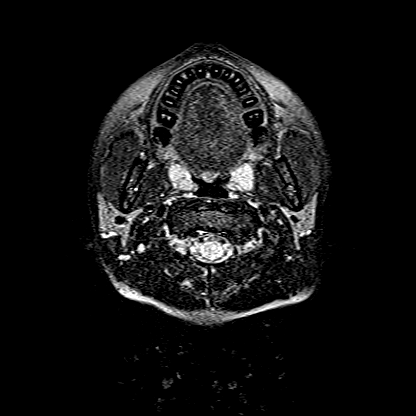
[im 28/55]
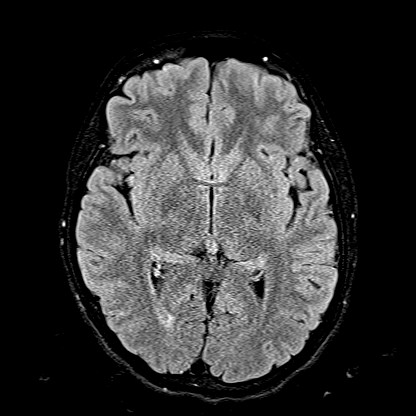
[im 55/55]
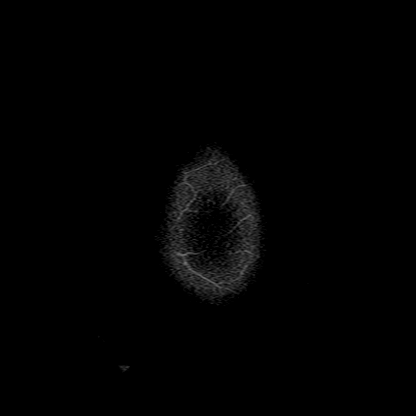

[Series 16: T1 · axial · 1.0mm · 0.98mm/px · z∈[-119,+53]mm · 10 of 176 slices shown (2 of 2)]
[im 1/176]
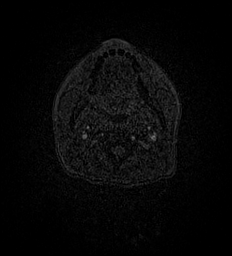
[im 20/176]
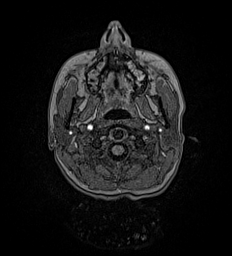
[im 39/176]
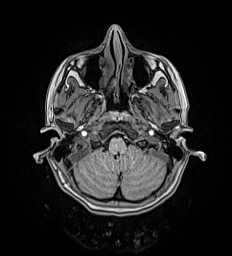
[im 59/176]
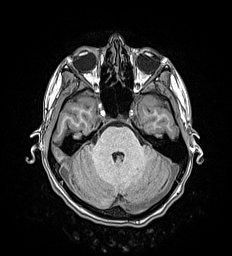
[im 78/176]
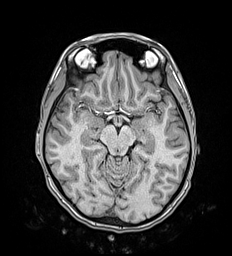
[im 98/176]
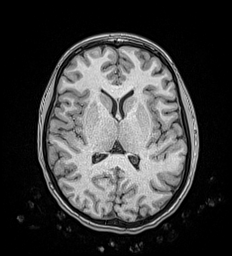
[im 117/176]
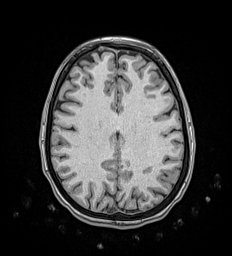
[im 137/176]
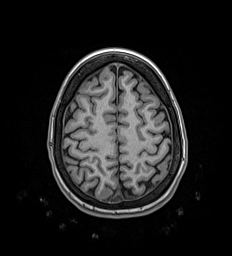
[im 156/176]
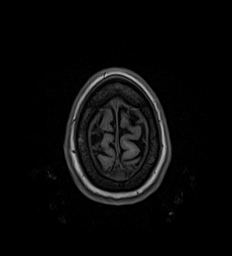
[im 176/176]
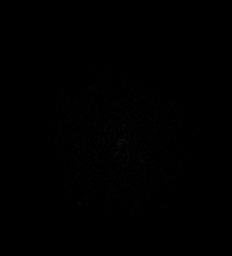

[Series 17: T2 post-contrast · coronal · 5.0mm · 0.57mm/px · 2 of 29 slices shown]
[im 1/29]
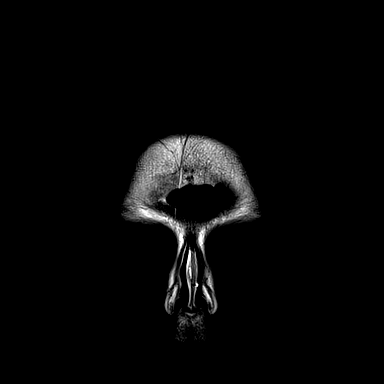
[im 29/29]
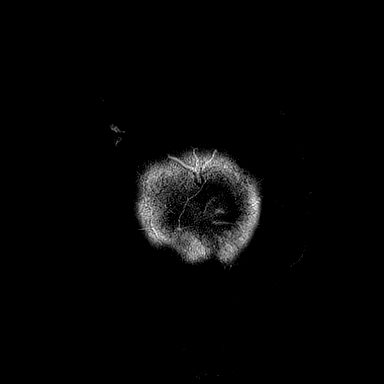

[Series 18: T1 post-contrast · axial · 1.0mm · 0.98mm/px · z∈[-119,+53]mm · 10 of 176 slices shown (1 of 3)]
[im 1/176]
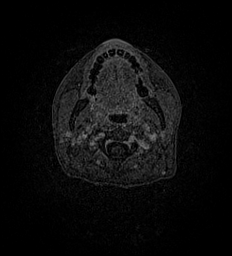
[im 20/176]
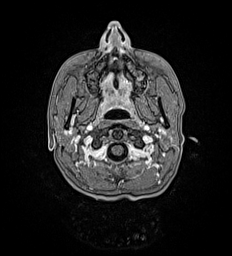
[im 39/176]
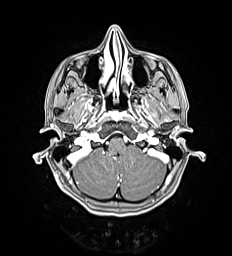
[im 59/176]
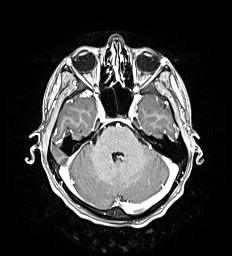
[im 78/176]
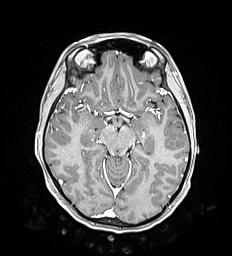
[im 98/176]
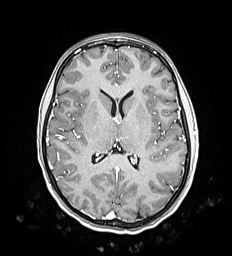
[im 117/176]
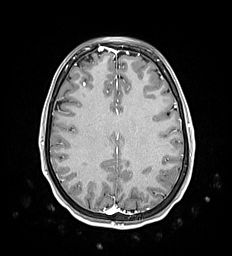
[im 137/176]
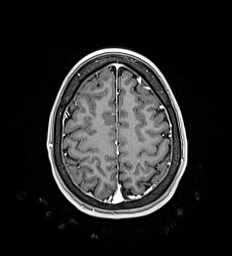
[im 156/176]
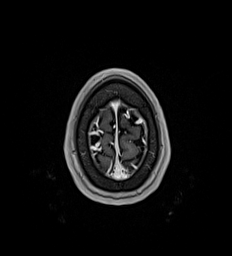
[im 176/176]
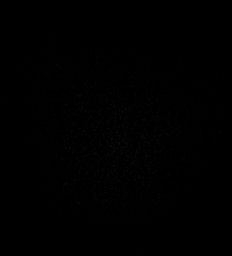

[Series 19: T1 post-contrast · coronal · 5.0mm · 0.57mm/px · 2 of 29 slices shown (2 of 3)]
[im 1/29]
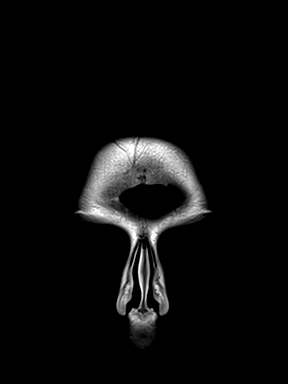
[im 29/29]
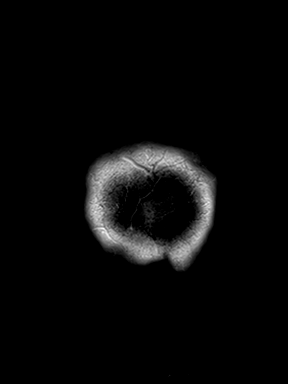

[Series 20: T1 post-contrast · sagittal · 5.0mm · 0.62mm/px · 1 of 21 slices shown (3 of 3)]
[im 1/21]
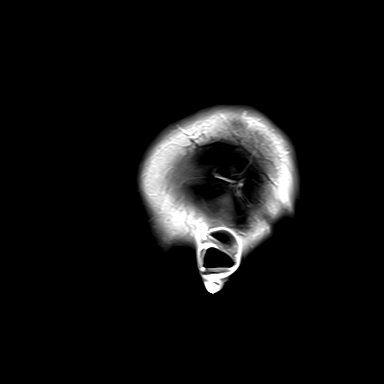

[48 of 48 positions shown; findings below may reference images not displayed]

FINDINGS: Brain: There is no acute infarction or intracranial hemorrhage.
There is no intracranial mass, mass effect, or edema. There is no
hydrocephalus or extra-axial fluid collection. Ventricles and sulci
are normal in size and configuration. There is a small right frontal
developmental venous anomaly.

Vascular: Major vessel flow voids at the skull base are preserved.

Skull and upper cervical spine: Normal marrow signal is preserved.

Sinuses/Orbits: Paranasal sinuses are aerated. Orbits are
unremarkable.

Other: Sella is unremarkable.  Mastoid air cells are clear.
IMPRESSION: No mass or significant abnormality.
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
## 2021-10-05 ENCOUNTER — Ambulatory Visit (INDEPENDENT_AMBULATORY_CARE_PROVIDER_SITE_OTHER): Payer: Managed Care, Other (non HMO) | Admitting: Obstetrics and Gynecology

## 2021-10-05 ENCOUNTER — Other Ambulatory Visit (HOSPITAL_COMMUNITY)
Admission: RE | Admit: 2021-10-05 | Discharge: 2021-10-05 | Disposition: A | Discharge status: Home / Self Care | Payer: Managed Care, Other (non HMO) | Source: Ambulatory Visit | Attending: Obstetrics and Gynecology | Admitting: Obstetrics and Gynecology

## 2021-10-05 ENCOUNTER — Encounter: Payer: Self-pay | Admitting: Obstetrics and Gynecology

## 2021-10-05 VITALS — BP 100/70 | Ht 63.0 in | Wt 126.0 lb

## 2021-10-05 DIAGNOSIS — Z30432 Encounter for removal of intrauterine contraceptive device: Secondary | ICD-10-CM

## 2021-10-05 DIAGNOSIS — Z202 Contact with and (suspected) exposure to infections with a predominantly sexual mode of transmission: Secondary | ICD-10-CM | POA: Diagnosis not present

## 2021-10-05 DIAGNOSIS — F419 Anxiety disorder, unspecified: Secondary | ICD-10-CM

## 2021-10-05 DIAGNOSIS — Z124 Encounter for screening for malignant neoplasm of cervix: Secondary | ICD-10-CM | POA: Insufficient documentation

## 2021-10-05 DIAGNOSIS — F32A Depression, unspecified: Secondary | ICD-10-CM

## 2021-10-05 DIAGNOSIS — Z113 Encounter for screening for infections with a predominantly sexual mode of transmission: Secondary | ICD-10-CM | POA: Diagnosis present

## 2021-10-05 DIAGNOSIS — Z01419 Encounter for gynecological examination (general) (routine) without abnormal findings: Secondary | ICD-10-CM | POA: Diagnosis not present

## 2021-10-05 MED ORDER — VALACYCLOVIR HCL 500 MG PO TABS: 500.0000 mg | ORAL_TABLET | Freq: Every day | ORAL | 3 refills | Status: DC

## 2021-10-05 NOTE — Patient Instructions (Signed)
I value your feedback and you entrusting us with your care. If you get a [facility] patient survey, I would appreciate you taking the time to let us know about your experience today. Thank you! ? ? ?

## 2021-10-09 LAB — CYTOLOGY - PAP
Chlamydia: NEGATIVE
Comment: NEGATIVE
Comment: NORMAL
Diagnosis: NEGATIVE
Neisseria Gonorrhea: NEGATIVE
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
## 2022-01-28 ENCOUNTER — Ambulatory Visit (INDEPENDENT_AMBULATORY_CARE_PROVIDER_SITE_OTHER): Payer: Managed Care, Other (non HMO) | Admitting: Obstetrics and Gynecology

## 2022-01-28 ENCOUNTER — Encounter: Payer: Self-pay | Admitting: Obstetrics and Gynecology

## 2022-01-28 ENCOUNTER — Other Ambulatory Visit (HOSPITAL_COMMUNITY)
Admission: RE | Admit: 2022-01-28 | Discharge: 2022-01-28 | Disposition: A | Discharge status: Home / Self Care | Payer: Managed Care, Other (non HMO) | Source: Ambulatory Visit | Attending: Obstetrics and Gynecology | Admitting: Obstetrics and Gynecology

## 2022-01-28 VITALS — BP 104/70 | Ht 63.0 in | Wt 115.0 lb

## 2022-01-28 DIAGNOSIS — R591 Generalized enlarged lymph nodes: Secondary | ICD-10-CM

## 2022-01-28 DIAGNOSIS — R399 Unspecified symptoms and signs involving the genitourinary system: Secondary | ICD-10-CM

## 2022-01-28 DIAGNOSIS — Z113 Encounter for screening for infections with a predominantly sexual mode of transmission: Secondary | ICD-10-CM

## 2022-01-28 DIAGNOSIS — Z30011 Encounter for initial prescription of contraceptive pills: Secondary | ICD-10-CM | POA: Diagnosis not present

## 2022-01-28 LAB — POCT URINALYSIS DIPSTICK
Bilirubin, UA: NEGATIVE
Glucose, UA: NEGATIVE
Ketones, UA: NEGATIVE
Nitrite, UA: NEGATIVE
Protein, UA: NEGATIVE
Spec Grav, UA: 1.015 (ref 1.010–1.025)
pH, UA: 7.5 (ref 5.0–8.0)

## 2022-01-28 MED ORDER — DROSPIRENONE-ETHINYL ESTRADIOL 3-0.02 MG PO TABS
1.0000 | ORAL_TABLET | Freq: Every day | ORAL | 3 refills | Status: DC
Start: 2022-01-28 — End: 2022-07-04

## 2022-01-28 NOTE — Patient Instructions (Signed)
I value your feedback and you entrusting us with your care. If you get a [facility] patient survey, I would appreciate you taking the time to let us know about your experience today. Thank you! ? ? ?

## 2022-01-30 LAB — CERVICOVAGINAL ANCILLARY ONLY
Chlamydia: NEGATIVE
Comment: NEGATIVE
Comment: NEGATIVE
Comment: NORMAL
Neisseria Gonorrhea: NEGATIVE
Trichomonas: NEGATIVE

## 2022-01-30 LAB — URINE CULTURE

## 2022-07-04 ENCOUNTER — Other Ambulatory Visit: Payer: Self-pay

## 2022-07-04 DIAGNOSIS — Z30011 Encounter for initial prescription of contraceptive pills: Secondary | ICD-10-CM

## 2022-07-04 MED ORDER — DROSPIRENONE-ETHINYL ESTRADIOL 3-0.02 MG PO TABS: 1.0000 | ORAL_TABLET | Freq: Every day | ORAL | 3 refills | Status: DC

## 2022-07-23 ENCOUNTER — Ambulatory Visit (INDEPENDENT_AMBULATORY_CARE_PROVIDER_SITE_OTHER): Payer: Managed Care, Other (non HMO)

## 2022-07-23 VITALS — BP 135/81 | HR 122 | Wt 112.7 lb

## 2022-07-23 DIAGNOSIS — N912 Amenorrhea, unspecified: Secondary | ICD-10-CM

## 2022-07-23 DIAGNOSIS — Z3202 Encounter for pregnancy test, result negative: Secondary | ICD-10-CM

## 2022-07-23 LAB — POCT URINE PREGNANCY: Preg Test, Ur: NEGATIVE

## 2022-07-23 NOTE — Progress Notes (Signed)
    NURSE VISIT NOTE  Subjective:    Patient ID: Tina [name], female    DOB: [birth year], 22 y.o.   MRN: [id number]  HPI  Patient is a 22 y.o. G0P0000 female who presents for evaluation of amenorrhea. She believes she could be pregnant. Pregnancy is desired. Sexual Activity: single partner, contraception: none. Current symptoms also include: nausea and positive home pregnancy test with a faint line, heartburn, lower abdominal pain, discharge. Last period was normal.    Objective:    BP 135/81   Pulse (!) 122   Wt 112 lb 11.2 oz (51.1 kg)   LMP 06/13/2022 (Exact Date)   BMI 19.96 kg/m   Lab Review  No results found for any visits on 07/23/22.  Assessment:   1. Amenorrhea     Plan:   Pregnancy Test: Negative. Offered to the patient we could do a Beta today and one on Thursday. Patient declined, patient was crying not in a bad way but because her body is off. Patient states she will go home and give it a few weeks or days and decide what to do next.      Burtis Junes, CMA

## 2022-07-24 ENCOUNTER — Other Ambulatory Visit: Payer: Self-pay

## 2022-07-24 ENCOUNTER — Encounter: Payer: Self-pay | Admitting: Obstetrics and Gynecology

## 2022-07-24 ENCOUNTER — Other Ambulatory Visit: Payer: Managed Care, Other (non HMO)

## 2022-07-24 DIAGNOSIS — Z3200 Encounter for pregnancy test, result unknown: Secondary | ICD-10-CM

## 2022-07-25 LAB — BETA HCG QUANT (REF LAB): hCG Quant: <1 m[IU]/mL

## 2022-07-26 ENCOUNTER — Other Ambulatory Visit: Payer: Managed Care, Other (non HMO)

## 2022-07-27 LAB — BETA HCG QUANT (REF LAB): hCG Quant: <1 m[IU]/mL

## 2022-08-07 ENCOUNTER — Ambulatory Visit: Payer: Managed Care, Other (non HMO) | Admitting: Obstetrics and Gynecology

## 2022-09-25 ENCOUNTER — Ambulatory Visit: Payer: Managed Care, Other (non HMO)

## 2022-09-25 VITALS — BP 110/70 | Ht 62.0 in | Wt 116.0 lb

## 2022-09-25 DIAGNOSIS — Z3201 Encounter for pregnancy test, result positive: Secondary | ICD-10-CM

## 2022-09-25 DIAGNOSIS — Z3200 Encounter for pregnancy test, result unknown: Secondary | ICD-10-CM

## 2022-09-25 DIAGNOSIS — Z3687 Encounter for antenatal screening for uncertain dates: Secondary | ICD-10-CM

## 2022-09-25 LAB — POCT URINE PREGNANCY: Preg Test, Ur: POSITIVE — AB

## 2022-09-25 NOTE — Progress Notes (Signed)
    NURSE VISIT NOTE  Subjective:    Patient ID: Tina [name], female    DOB: 2001-02-27, 22 y.o.   MRN: [id number]  HPI  Patient is a 22 y.o. G46P0000 female who presents for evaluation of amenorrhea. She believes she could be pregnant. Current symptoms also include: breast tenderness, fatigue, and dry skin with irritability . Last period was normal.    Objective:    BP 110/70   Ht 5\' 2"  (1.575 m)   Wt 116 lb (52.6 kg)   LMP 08/22/2022   BMI 21.22 kg/m   Lab Review  No results found for any visits on 09/25/22.  Assessment:   1. Possible pregnancy, not confirmed   2. Encounter for antenatal screening for uncertain dates     Plan:   Pregnancy Test: Positive      Loney Laurence, CMA

## 2022-10-01 ENCOUNTER — Telehealth: Payer: Self-pay

## 2022-10-01 NOTE — Telephone Encounter (Signed)
Pt calling to see what medications on her med list is safe for her to take while preg; has positive preg test but no appt yet.  [phone]

## 2022-10-07 ENCOUNTER — Ambulatory Visit: Payer: Managed Care, Other (non HMO)

## 2022-10-07 DIAGNOSIS — Z3480 Encounter for supervision of other normal pregnancy, unspecified trimester: Secondary | ICD-10-CM

## 2022-10-07 DIAGNOSIS — O0972 Supervision of high risk pregnancy due to social problems, second trimester: Secondary | ICD-10-CM | POA: Insufficient documentation

## 2022-10-07 NOTE — Patient Instructions (Signed)
First Trimester of Pregnancy  The first trimester of pregnancy starts on the first day of your last menstrual period until the end of week 12. This is also called months 1 through 3 of pregnancy. Body changes during your first trimester Your body goes through many changes during pregnancy. The changes usually return to normal after your baby is born. Physical changes You may gain or lose weight. Your breasts may grow larger and hurt. The area around your nipples may get darker. Dark spots or blotches may develop on your face. You may have changes in your hair. Health changes You may feel like you might vomit (nauseous), and you may vomit. You may have heartburn. You may have headaches. You may have trouble pooping (constipation). Your gums may bleed. Other changes You may get tired easily. You may pee (urinate) more often. Your menstrual periods will stop. You may not feel hungry. You may want to eat certain kinds of food. You may have changes in your emotions from day to day. You may have more dreams. Follow these instructions at home: Medicines Take over-the-counter and prescription medicines only as told by your doctor. Some medicines are not safe during pregnancy. Take a prenatal vitamin that contains at least 600 micrograms (mcg) of folic acid. Eating and drinking Eat healthy meals that include: Fresh fruits and vegetables. Whole grains. Good sources of protein, such as meat, eggs, or tofu. Low-fat dairy products. Avoid raw meat and unpasteurized juice, milk, and cheese. If you feel like you may vomit, or you vomit: Eat 4 or 5 small meals a day instead of 3 large meals. Try eating a few soda crackers. Drink liquids between meals instead of during meals. You may need to take these actions to prevent or treat trouble pooping: Drink enough fluids to keep your pee (urine) pale yellow. Eat foods that are high in fiber. These include beans, whole grains, and fresh fruits and  vegetables. Limit foods that are high in fat and sugar. These include fried or sweet foods. Activity Exercise only as told by your doctor. Most people can do their usual exercise routine during pregnancy. Stop exercising if you have cramps or pain in your lower belly (abdomen) or low back. Do not exercise if it is too hot or too humid, or if you are in a place of great height (high altitude). Avoid heavy lifting. If you choose to, you may have sex unless your doctor tells you not to. Relieving pain and discomfort Wear a good support bra if your breasts are sore. Rest with your legs raised (elevated) if you have leg cramps or low back pain. If you have bulging veins (varicose veins) in your legs: Wear support hose as told by your doctor. Raise your feet for 15 minutes, 3-4 times a day. Limit salt in your food. Safety Wear your seat belt at all times when you are in a car. Talk with your doctor if someone is hurting you or yelling at you. Talk with your doctor if you are feeling sad or have thoughts of hurting yourself. Lifestyle Do not use hot tubs, steam rooms, or saunas. Do not douche. Do not use tampons or scented sanitary pads. Do not use herbal medicines, illegal drugs, or medicines that are not approved by your doctor. Do not drink alcohol. Do not smoke or use any products that contain nicotine or tobacco. If you need help quitting, ask your doctor. Avoid cat litter boxes and soil that is used by cats. These carry   germs that can cause harm to the baby and can cause a loss of your baby by miscarriage or stillbirth. General instructions Keep all follow-up visits. This is important. Ask for help if you need counseling or if you need help with nutrition. Your doctor can give you advice or tell you where to go for help. Visit your dentist. At home, brush your teeth with a soft toothbrush. Floss gently. Write down your questions. Take them to your prenatal visits. Where to find more  information American Pregnancy Association: americanpregnancy.org American College of Obstetricians and Gynecologists: www.acog.org Office on Women's Health: womenshealth.gov/pregnancy Contact a doctor if: You are dizzy. You have a fever. You have mild cramps or pressure in your lower belly. You have a nagging pain in your belly area. You continue to feel like you may vomit, you vomit, or you have watery poop (diarrhea) for 24 hours or longer. You have a bad-smelling fluid coming from your vagina. You have pain when you pee. You are exposed to a disease that spreads from person to person, such as chickenpox, measles, Zika virus, HIV, or hepatitis. Get help right away if: You have spotting or bleeding from your vagina. You have very bad belly cramping or pain. You have shortness of breath or chest pain. You have any kind of injury, such as from a fall or a car crash. You have new or increased pain, swelling, or redness in an arm or leg. Summary The first trimester of pregnancy starts on the first day of your last menstrual period until the end of week 12 (months 1 through 3). Eat 4 or 5 small meals a day instead of 3 large meals. Do not smoke or use any products that contain nicotine or tobacco. If you need help quitting, ask your doctor. Keep all follow-up visits. This information is not intended to replace advice given to you by your health care provider. Make sure you discuss any questions you have with your health care provider. Document Revised: 08/04/2019 Document Reviewed: 06/10/2019 Elsevier Patient Education  2024 [employer] Commonly Asked Questions During Pregnancy  Cats: A parasite can be excreted in cat feces.  To avoid exposure you need to have another person empty the little box.  If you must empty the litter box you will need to wear gloves.  Wash your hands after handling your cat.  This parasite can also be found in raw or undercooked meat so this should also be  avoided.  Colds, Sore Throats, Flu: Please check your medication sheet to see what you can take for symptoms.  If your symptoms are unrelieved by these medications please call the office.  Dental Work: Most any dental work your dentist recommends is permitted.  X-rays should only be taken during the first trimester if absolutely necessary.  Your abdomen should be shielded with a lead apron during all x-rays.  Please notify your provider prior to receiving any x-rays.  Novocaine is fine; gas is not recommended.  If your dentist requires a note from us prior to dental work please call the office and we will provide one for you.  Exercise: Exercise is an important part of staying healthy during your pregnancy.  You may continue most exercises you were accustomed to prior to pregnancy.  Later in your pregnancy you will most likely notice you have difficulty with activities requiring balance like riding a bicycle.  It is important that you listen to your body and avoid activities that put you at a higher   risk of falling.  Adequate rest and staying well hydrated are a must!  If you have questions about the safety of specific activities ask your provider.    Exposure to Children with illness: Try to avoid obvious exposure; report any symptoms to us when noted,  If you have chicken pos, red measles or mumps, you should be immune to these diseases.   Please do not take any vaccines while pregnant unless you have checked with your OB provider.  Fetal Movement: After 28 weeks we recommend you do "kick counts" twice daily.  Lie or sit down in a calm quiet environment and count your baby movements "kicks".  You should feel your baby at least 10 times per hour.  If you have not felt 10 kicks within the first hour get up, walk around and have something sweet to eat or drink then repeat for an additional hour.  If count remains less than 10 per hour notify your provider.  Fumigating: Follow your pest control agent's  advice as to how long to stay out of your home.  Ventilate the area well before re-entering.  Hemorrhoids:   Most over-the-counter preparations can be used during pregnancy.  Check your medication to see what is safe to use.  It is important to use a stool softener or fiber in your diet and to drink lots of liquids.  If hemorrhoids seem to be getting worse please call the office.   Hot Tubs:  Hot tubs Jacuzzis and saunas are not recommended while pregnant.  These increase your internal body temperature and should be avoided.  Intercourse:  Sexual intercourse is safe during pregnancy as long as you are comfortable, unless otherwise advised by your provider.  Spotting may occur after intercourse; report any bright red bleeding that is heavier than spotting.  Labor:  If you know that you are in labor, please go to the hospital.  If you are unsure, please call the office and let us help you decide what to do.  Lifting, straining, etc:  If your job requires heavy lifting or straining please check with your provider for any limitations.  Generally, you should not lift items heavier than that you can lift simply with your hands and arms (no back muscles)  Painting:  Paint fumes do not harm your pregnancy, but may make you ill and should be avoided if possible.  Latex or water based paints have less odor than oils.  Use adequate ventilation while painting.  Permanents & Hair Color:  Chemicals in hair dyes are not recommended as they cause increase hair dryness which can increase hair loss during pregnancy.  " Highlighting" and permanents are allowed.  Dye may be absorbed differently and permanents may not hold as well during pregnancy.  Sunbathing:  Use a sunscreen, as skin burns easily during pregnancy.  Drink plenty of fluids; avoid over heating.  Tanning Beds:  Because their possible side effects are still unknown, tanning beds are not recommended.  Ultrasound Scans:  Routine ultrasounds are performed  at approximately 20 weeks.  You will be able to see your baby's general anatomy an if you would like to know the gender this can usually be determined as well.  If it is questionable when you conceived you may also receive an ultrasound early in your pregnancy for dating purposes.  Otherwise ultrasound exams are not routinely performed unless there is a medical necessity.  Although you can request a scan we ask that you pay for it when   conducted because insurance does not cover " patient request" scans.  Work: If your pregnancy proceeds without complications you may work until your due date, unless your physician or employer advises otherwise.  Round Ligament Pain/Pelvic Discomfort:  Sharp, shooting pains not associated with bleeding are fairly common, usually occurring in the second trimester of pregnancy.  They tend to be worse when standing up or when you remain standing for long periods of time.  These are the result of pressure of certain pelvic ligaments called "round ligaments".  Rest, Tylenol and heat seem to be the most effective relief.  As the womb and fetus grow, they rise out of the pelvis and the discomfort improves.  Please notify the office if your pain seems different than that described.  It may represent a more serious condition.  Common Medications Safe in Pregnancy  Acne:      Constipation:  Benzoyl Peroxide     Colace  Clindamycin      Dulcolax Suppository  Topica Erythromycin     Fibercon  Salicylic Acid      Metamucil         Miralax AVOID:        Senakot   Accutane    Cough:  Retin-A       Cough Drops  Tetracycline      Phenergan w/ Codeine if Rx  Minocycline      Robitussin (Plain & DM)  Antibiotics:     Crabs/Lice:  Ceclor       RID  Cephalosporins    AVOID:  E-Mycins      Kwell  Keflex  Macrobid/Macrodantin   Diarrhea:  Penicillin      Kao-Pectate  Zithromax      Imodium AD         PUSH FLUIDS AVOID:       Cipro     Fever:  Tetracycline      Tylenol (Regular  or Extra  Minocycline       Strength)  Levaquin      Extra Strength-Do not          Exceed 8 tabs/24 hrs Caffeine:        <200mg/day (equiv. To 1 cup of coffee or  approx. 3 12 oz sodas)         Gas: Cold/Hayfever:       Gas-X  Benadryl      Mylicon  Claritin       Phazyme  **Claritin-D        Chlor-Trimeton    Headaches:  Dimetapp      ASA-Free Excedrin  Drixoral-Non-Drowsy     Cold Compress  Mucinex (Guaifenasin)     Tylenol (Regular or Extra  Sudafed/Sudafed-12 Hour     Strength)  **Sudafed PE Pseudoephedrine   Tylenol Cold & Sinus     Vicks Vapor Rub  Zyrtec  **AVOID if Problems With Blood Pressure         Heartburn: Avoid lying down for at least 1 hour after meals  Aciphex      Maalox     Rash:  Milk of Magnesia     Benadryl    Mylanta       1% Hydrocortisone Cream  Pepcid  Pepcid Complete   Sleep Aids:  Prevacid      Ambien   Prilosec       Benadryl  Rolaids       Chamomile Tea  Tums (Limit 4/day)     Unisom           Tylenol PM         Warm milk-add vanilla or  Hemorrhoids:       Sugar for taste  Anusol/Anusol H.C.  (RX: Analapram 2.5%)  Sugar Substitutes:  Hydrocortisone OTC     Ok in moderation  Preparation H      Tucks        Vaseline lotion applied to tissue with wiping    Herpes:     Throat:  Acyclovir      Oragel  Famvir  Valtrex     Vaccines:         Flu Shot Leg Cramps:       *Gardasil  Benadryl      Hepatitis A         Hepatitis B Nasal Spray:       Pneumovax  Saline Nasal Spray     Polio Booster         Tetanus Nausea:       Tuberculosis test or PPD  Vitamin B6 25 mg TID   AVOID:    Dramamine      *Gardasil  Emetrol       Live Poliovirus  Ginger Root 250 mg QID    MMR (measles, mumps &  High Complex Carbs @ Bedtime    rebella)  Sea Bands-Accupressure    Varicella (Chickenpox)  Unisom 1/2 tab TID     *No known complications           If received before Pain:         Known pregnancy;   Darvocet       Resume series  after  Lortab        Delivery  Percocet    Yeast:   Tramadol      Femstat  Tylenol 3      Gyne-lotrimin  Ultram       Monistat  Vicodin           MISC:         All Sunscreens           Hair Coloring/highlights          Insect Repellant's          (Including DEET)         Mystic Tans  

## 2022-10-07 NOTE — Progress Notes (Signed)
New OB Intake  I connected with  Tina [name] on 10/07/22 at 10:15 AM EDT by telephone and verified that I am speaking with the correct person using two identifiers. Nurse is located at [facility] and pt is located at home.  I explained I am completing New OB Intake today. We discussed her EDD of 05/29/2023 that is based on LMP of 08/22/2022. Pt is G2/P0010. I reviewed her allergies, medications, Medical/Surgical/OB history, and appropriate screenings. There are no cats in the home. Based on history, this is a/an pregnancy uncomplicated .   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  Anatomy US Explained first scheduled [place] will be Aug 6th and an anatomy scan will be done at 20 weeks.  Labs Discussed genetic screening with patient. Patient desires genetic testing to be drawn at new OB visit. Discussed possible labs to be drawn at new OB appointment.  COVID Vaccine Patient has not had COVID vaccine.   Social Determinants of Health Food Insecurity: expresses food insecurity. Information given on local food banks. Transportation: Patient expressed transportation needs.    First visit review I reviewed new OB appt with pt. I explained she will have ob bloodwork and pap smear/pelvic exam if indicated. Explained pt will be seen by an AOB provider at first visit; encounter routed to appropriate provider.   Loran Senters, [facility] 10/07/2022  10:57 AM

## 2022-10-15 ENCOUNTER — Ambulatory Visit: Payer: Managed Care, Other (non HMO)

## 2022-10-15 DIAGNOSIS — Z3200 Encounter for pregnancy test, result unknown: Secondary | ICD-10-CM

## 2022-10-15 DIAGNOSIS — Z3A01 Less than 8 weeks gestation of pregnancy: Secondary | ICD-10-CM | POA: Diagnosis not present

## 2022-10-15 DIAGNOSIS — Z3687 Encounter for antenatal screening for uncertain dates: Secondary | ICD-10-CM | POA: Diagnosis not present

## 2022-10-18 ENCOUNTER — Telehealth: Payer: Self-pay

## 2022-10-18 NOTE — Telephone Encounter (Signed)
Pt left msg on triage stating she is having a lot of nausea and some vomiting. Went through nausea protocol with her and this is what what advised:  Patient states symptoms of nausea/vomiting. Patient states she has  been able to tolerate liquids for the last 12 hours. No signs of dehydration are present.   Plan: Advise saltine crackers before getting out of bed, eating small bland meals, do not allow and empty or overly full stomach, avoid greasy, acidic or spicy foods, try ginger 250 mg 4xday or ginger tea and to try B6 3xday along with Unisom.    She doesn't have a car to go get plain vitamin B6 and wants to know if Super B Complex vitamins that she has at home already would be okay?

## 2022-10-18 NOTE — Telephone Encounter (Signed)
Pt aware.

## 2022-10-21 ENCOUNTER — Ambulatory Visit: Payer: Managed Care, Other (non HMO) | Admitting: Obstetrics and Gynecology

## 2022-11-15 ENCOUNTER — Ambulatory Visit (INDEPENDENT_AMBULATORY_CARE_PROVIDER_SITE_OTHER): Payer: Managed Care, Other (non HMO) | Admitting: Certified Nurse Midwife

## 2022-11-15 ENCOUNTER — Encounter: Payer: Self-pay | Admitting: Certified Nurse Midwife

## 2022-11-15 VITALS — BP 101/66 | HR 94 | Wt 112.0 lb

## 2022-11-15 DIAGNOSIS — Z3480 Encounter for supervision of other normal pregnancy, unspecified trimester: Secondary | ICD-10-CM

## 2022-11-15 DIAGNOSIS — J45909 Unspecified asthma, uncomplicated: Secondary | ICD-10-CM

## 2022-11-15 DIAGNOSIS — Z3A12 12 weeks gestation of pregnancy: Secondary | ICD-10-CM | POA: Diagnosis not present

## 2022-11-15 DIAGNOSIS — Z113 Encounter for screening for infections with a predominantly sexual mode of transmission: Secondary | ICD-10-CM

## 2022-11-15 DIAGNOSIS — Z0184 Encounter for antibody response examination: Secondary | ICD-10-CM

## 2022-11-15 DIAGNOSIS — Z3481 Encounter for supervision of other normal pregnancy, first trimester: Secondary | ICD-10-CM | POA: Diagnosis not present

## 2022-11-15 DIAGNOSIS — Z1379 Encounter for other screening for genetic and chromosomal anomalies: Secondary | ICD-10-CM

## 2022-11-15 DIAGNOSIS — B009 Herpesviral infection, unspecified: Secondary | ICD-10-CM

## 2022-11-15 MED ORDER — ONDANSETRON 4 MG PO TBDP
4.0000 mg | ORAL_TABLET | Freq: Three times a day (TID) | ORAL | 1 refills | Status: AC | PRN
Start: 2022-11-15 — End: ?

## 2022-11-15 MED ORDER — VALACYCLOVIR HCL 500 MG PO TABS
ORAL_TABLET | ORAL | 5 refills | Status: AC
Start: 2022-11-15 — End: ?

## 2022-11-16 LAB — HCV INTERPRETATION

## 2022-11-16 LAB — CBC/D/PLT+RPR+RH+ABO+RUBIGG...
Antibody Screen: NEGATIVE
Basophils Absolute: 0 10*3/uL (ref 0.0–0.2)
Basos: 0 %
EOS (ABSOLUTE): 0.1 10*3/uL (ref 0.0–0.4)
Eos: 2 %
HCV Ab: NONREACTIVE
HIV Screen 4th Generation wRfx: NONREACTIVE
Hematocrit: 30.7 % — ABNORMAL LOW (ref 34.0–46.6)
Hemoglobin: 10.2 g/dL — ABNORMAL LOW (ref 11.1–15.9)
Hepatitis B Surface Ag: NEGATIVE
Immature Grans (Abs): 0 10*3/uL (ref 0.0–0.1)
Immature Granulocytes: 0 %
Lymphocytes Absolute: 2.3 10*3/uL (ref 0.7–3.1)
Lymphs: 31 %
MCH: 28.8 pg (ref 26.6–33.0)
MCHC: 33.2 g/dL (ref 31.5–35.7)
MCV: 87 fL (ref 79–97)
Monocytes Absolute: 0.8 10*3/uL (ref 0.1–0.9)
Monocytes: 11 %
Neutrophils Absolute: 4.3 10*3/uL (ref 1.4–7.0)
Neutrophils: 56 %
Platelets: 256 10*3/uL (ref 150–450)
RBC: 3.54 x10E6/uL — ABNORMAL LOW (ref 3.77–5.28)
RDW: 13.6 % (ref 11.7–15.4)
RPR Ser Ql: NONREACTIVE
Rh Factor: POSITIVE
Rubella Antibodies, IGG: 3.16 index (ref >0.99)
Varicella zoster IgG: 816 {index} (ref >165)
WBC: 7.6 10*3/uL (ref 3.4–10.8)

## 2022-11-16 LAB — URINALYSIS, ROUTINE W REFLEX MICROSCOPIC
Bilirubin, UA: NEGATIVE
Glucose, UA: NEGATIVE
Leukocytes,UA: NEGATIVE
Nitrite, UA: NEGATIVE
RBC, UA: NEGATIVE
Specific Gravity, UA: 1.021 (ref 1.005–1.030)
Urobilinogen, Ur: 1 mg/dL (ref 0.2–1.0)
pH, UA: 6.5 (ref 5.0–7.5)

## 2022-11-16 LAB — MICROSCOPIC EXAMINATION
Bacteria, UA: NONE SEEN
Casts: NONE SEEN /LPF

## 2022-11-17 ENCOUNTER — Encounter: Payer: Self-pay | Admitting: Certified Nurse Midwife

## 2022-11-17 DIAGNOSIS — B009 Herpesviral infection, unspecified: Secondary | ICD-10-CM | POA: Insufficient documentation
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
## 2022-11-17 NOTE — Assessment & Plan Note (Signed)
Suppression at 36w

## 2022-11-17 NOTE — Patient Instructions (Signed)
First Trimester of Pregnancy  The first trimester of pregnancy starts on the first day of your last menstrual period until the end of week 12. This is months 1 through 3 of pregnancy. A week after a sperm fertilizes an egg, the egg will implant into the wall of the uterus and begin to develop into a baby. By the end of 12 weeks, all the baby's organs will be formed and the baby will be 2-3 inches in size. Body changes during your first trimester Your body goes through many changes during pregnancy. The changes vary and generally return to normal after your baby is born. Physical changes You may gain or lose weight. Your breasts may begin to grow larger and become tender. The tissue that surrounds your nipples (areola) may become darker. Dark spots or blotches (chloasma or mask of pregnancy) may develop on your face. You may have changes in your hair. These can include thickening or thinning of your hair or changes in texture. Health changes You may feel nauseous, and you may vomit. You may have heartburn. You may develop headaches. You may develop constipation. Your gums may bleed and may be sensitive to brushing and flossing. Other changes You may tire easily. You may urinate more often. Your menstrual periods will stop. You may have a loss of appetite. You may develop cravings for certain kinds of food. You may have changes in your emotions from day to day. You may have more vivid and strange dreams. Follow these instructions at home: Medicines Follow your health care provider's instructions regarding medicine use. Specific medicines may be either safe or unsafe to take during pregnancy. Do not take any medicines unless told to by your health care provider. Take a prenatal vitamin that contains at least 600 micrograms (mcg) of folic acid. Eating and drinking Eat a healthy diet that includes fresh fruits and vegetables, whole grains, good sources of protein such as meat, eggs, or tofu,  and low-fat dairy products. Avoid raw meat and unpasteurized juice, milk, and cheese. These carry germs that can harm you and your baby. If you feel nauseous or you vomit: Eat 4 or 5 small meals a day instead of 3 large meals. Try eating a few soda crackers. Drink liquids between meals instead of during meals. You may need to take these actions to prevent or treat constipation: Drink enough fluid to keep your urine pale yellow. Eat foods that are high in fiber, such as beans, whole grains, and fresh fruits and vegetables. Limit foods that are high in fat and processed sugars, such as fried or sweet foods. Activity Exercise only as directed by your health care provider. Most people can continue their usual exercise routine during pregnancy. Try to exercise for 30 minutes at least 5 days a week. Stop exercising if you develop pain or cramping in the lower abdomen or lower back. Avoid exercising if it is very hot or humid or if you are at high altitude. Avoid heavy lifting. If you choose to, you may have sex unless your health care provider tells you not to. Relieving pain and discomfort Wear a good support bra to relieve breast tenderness. Rest with your legs elevated if you have leg cramps or low back pain. If you develop bulging veins (varicose veins) in your legs: Wear support hose as told by your health care provider. Elevate your feet for 15 minutes, 3-4 times a day. Limit salt in your diet. Safety Wear your seat belt at all times when  driving or riding in a car. Talk with your health care provider if someone is verbally or physically abusive to you. Talk with your health care provider if you are feeling sad or have thoughts of hurting yourself. Lifestyle Do not use hot tubs, steam rooms, or saunas. Do not douche. Do not use tampons or scented sanitary pads. Do not use herbal remedies, alcohol, illegal drugs, or medicines that are not approved by your health care provider. Chemicals  in these products can harm your baby. Do not use any products that contain nicotine or tobacco, such as cigarettes, e-cigarettes, and chewing tobacco. If you need help quitting, ask your health care provider. Avoid cat litter boxes and soil used by cats. These carry germs that can cause birth defects in the baby and possibly loss of the unborn baby (fetus) by miscarriage or stillbirth. General instructions During routine prenatal visits in the first trimester, your health care provider will do a physical exam, perform necessary tests, and ask you how things are going. Keep all follow-up visits. This is important. Ask for help if you have counseling or nutritional needs during pregnancy. Your health care provider can offer advice or refer you to specialists for help with various needs. Schedule a dentist appointment. At home, brush your teeth with a soft toothbrush. Floss gently. Write down your questions. Take them to your prenatal visits. Where to find more information American Pregnancy Association: americanpregnancy.org [employer] of Obstetricians and Gynecologists: https://www.todd-brady.net/ Office on [employer] Health: MightyReward.co.nz Contact a health care provider if you have: Dizziness. A fever. Mild pelvic cramps, pelvic pressure, or nagging pain in the abdominal area. Nausea, vomiting, or diarrhea that lasts for 24 hours or longer. A bad-smelling vaginal discharge. Pain when you urinate. Known exposure to a contagious illness, such as chickenpox, measles, Zika virus, HIV, or hepatitis. Get help right away if you have: Spotting or bleeding from your vagina. Severe abdominal cramping or pain. Shortness of breath or chest pain. Any kind of trauma, such as from a fall or a car crash. New or increased pain, swelling, or redness in an arm or leg. Summary The first trimester of pregnancy starts on the first day of your last menstrual period until the end of week  12 (months 1 through 3). Eating 4 or 5 small meals a day rather than 3 large meals may help to relieve nausea and vomiting. Do not use any products that contain nicotine or tobacco, such as cigarettes, e-cigarettes, and chewing tobacco. If you need help quitting, ask your health care provider. Keep all follow-up visits. This is important. This information is not intended to replace advice given to you by your health care provider. Make sure you discuss any questions you have with your health care provider. Document Revised: 08/04/2019 Document Reviewed: 06/10/2019 Elsevier Patient Education  2024 Elsevier Inc.  Second Trimester of Pregnancy  The second trimester of pregnancy is from week 13 through week 27. This is months 4 through 6 of pregnancy. The second trimester is often a time when you feel your best. Your body has adjusted to being pregnant, and you begin to feel better physically. During the second trimester: Morning sickness has lessened or stopped completely. You may have more energy. You may have an increase in appetite. The second trimester is also a time when the unborn baby (fetus) is growing rapidly. At the end of the sixth month, the fetus may be up to 12 inches long and weigh about 1 pounds. You will likely  begin to feel the baby move (quickening) between 16 and 20 weeks of pregnancy. Body changes during your second trimester Your body continues to go through many changes during your second trimester. The changes vary and generally return to normal after the baby is born. Physical changes Your weight will continue to increase. You will notice your lower abdomen bulging out. You may begin to get stretch marks on your hips, abdomen, and breasts. Your breasts will continue to grow and to become tender. Dark spots or blotches (chloasma or mask of pregnancy) may develop on your face. A dark line from your belly button to the pubic area (linea nigra) may appear. You may have  changes in your hair. These can include thickening of your hair, rapid growth, and changes in texture. Some people also have hair loss during or after pregnancy, or hair that feels dry or thin. Health changes You may develop headaches. You may have heartburn. You may develop constipation. You may develop hemorrhoids or swollen, bulging veins (varicose veins). Your gums may bleed and may be sensitive to brushing and flossing. You may urinate more often because the fetus is pressing on your bladder. You may have back pain. This is caused by: Weight gain. Pregnancy hormones that are relaxing the joints in your pelvis. A shift in weight and the muscles that support your balance. Follow these instructions at home: Medicines Follow your health care provider's instructions regarding medicine use. Specific medicines may be either safe or unsafe to take during pregnancy. Do not take any medicines unless approved by your health care provider. Take a prenatal vitamin that contains at least 600 micrograms (mcg) of folic acid. Eating and drinking Eat a healthy diet that includes fresh fruits and vegetables, whole grains, good sources of protein such as meat, eggs, or tofu, and low-fat dairy products. Avoid raw meat and unpasteurized juice, milk, and cheese. These carry germs that can harm you and your baby. You may need to take these actions to prevent or treat constipation: Drink enough fluid to keep your urine pale yellow. Eat foods that are high in fiber, such as beans, whole grains, and fresh fruits and vegetables. Limit foods that are high in fat and processed sugars, such as fried or sweet foods. Activity Exercise only as directed by your health care provider. Most people can continue their usual exercise routine during pregnancy. Try to exercise for 30 minutes at least 5 days a week. Stop exercising if you develop contractions in your uterus. Stop exercising if you develop pain or cramping in the  lower abdomen or lower back. Avoid exercising if it is very hot or humid or if you are at a high altitude. Avoid heavy lifting. If you choose to, you may have sex unless your health care provider tells you not to. Relieving pain and discomfort Wear a supportive bra to prevent discomfort from breast tenderness. Take warm sitz baths to soothe any pain or discomfort caused by hemorrhoids. Use hemorrhoid cream if your health care provider approves. Rest with your legs raised (elevated) if you have leg cramps or low back pain. If you develop varicose veins: Wear support hose as told by your health care provider. Elevate your feet for 15 minutes, 3-4 times a day. Limit salt in your diet. Safety Wear your seat belt at all times when driving or riding in a car. Talk with your health care provider if someone is verbally or physically abusive to you. Lifestyle Do not use hot tubs, steam rooms,  or saunas. Do not douche. Do not use tampons or scented sanitary pads. Avoid cat litter boxes and soil used by cats. These carry germs that can cause birth defects in the baby and possibly loss of the fetus by miscarriage or stillbirth. Do not use herbal remedies, alcohol, illegal drugs, or medicines that are not approved by your health care provider. Chemicals in these products can harm your baby. Do not use any products that contain nicotine or tobacco, such as cigarettes, e-cigarettes, and chewing tobacco. If you need help quitting, ask your health care provider. General instructions During a routine prenatal visit, your health care provider will do a physical exam and other tests. He or she will also discuss your overall health. Keep all follow-up visits. This is important. Ask your health care provider for a referral to a local prenatal education class. Ask for help if you have counseling or nutritional needs during pregnancy. Your health care provider can offer advice or refer you to specialists for help  with various needs. Where to find more information American Pregnancy Association: americanpregnancy.org [employer] of [employer]stetricians and Gynecologists: https://www.todd-brady.net/ Office on [employer] Health: MightyReward.co.nz Contact a health care provider if you have: A headache that does not go away when you take medicine. Vision changes or you see spots in front of your eyes. Mild pelvic cramps, pelvic pressure, or nagging pain in the abdominal area. Persistent nausea, vomiting, or diarrhea. A bad-smelling vaginal discharge or foul-smelling urine. Pain when you urinate. Sudden or extreme swelling of your face, hands, ankles, feet, or legs. A fever. Get help right away if you: Have fluid leaking from your vagina. Have spotting or bleeding from your vagina. Have severe abdominal cramping or pain. Have difficulty breathing. Have chest pain. Have fainting spells. Have not felt your baby move for the time period told by your health care provider. Have new or increased pain, swelling, or redness in an arm or leg. Summary The second trimester of pregnancy is from week 13 through week 27 (months 4 through 6). Do not use herbal remedies, alcohol, illegal drugs, or medicines that are not approved by your health care provider. Chemicals in these products can harm your baby. Exercise only as directed by your health care provider. Most people can continue their usual exercise routine during pregnancy. Keep all follow-up visits. This is important. This information is not intended to replace advice given to you by your health care provider. Make sure you discuss any questions you have with your health care provider. Document Revised: 08/04/2019 Document Reviewed: 06/10/2019 Elsevier Patient Education  2024 [employer].

## 2022-11-18 ENCOUNTER — Other Ambulatory Visit: Payer: Self-pay | Admitting: Certified Nurse Midwife

## 2022-11-18 LAB — URINE CULTURE, OB REFLEX

## 2022-11-18 LAB — CULTURE, OB URINE

## 2022-11-18 LAB — NUSWAB VAGINITIS PLUS (VG+)
Atopobium vaginae: HIGH {score} — AB
BVAB 2: HIGH {score} — AB
Candida albicans, NAA: NEGATIVE
Candida glabrata, NAA: NEGATIVE
Chlamydia trachomatis, NAA: NEGATIVE
Megasphaera 1: HIGH {score} — AB
Neisseria gonorrhoeae, NAA: NEGATIVE
Trich vag by NAA: NEGATIVE

## 2022-11-18 MED ORDER — AMOXICILLIN-POT CLAVULANATE 500-125 MG PO TABS: 500.0000 mg | ORAL_TABLET | Freq: Two times a day (BID) | ORAL | 0 refills | Status: AC

## 2022-11-18 MED ORDER — METRONIDAZOLE 500 MG PO TABS
500.0000 mg | ORAL_TABLET | Freq: Two times a day (BID) | ORAL | 0 refills | Status: AC
Start: 2022-11-18 — End: 2022-11-25

## 2022-11-18 MED ORDER — METRONIDAZOLE 0.75 % VA GEL: 1.0000 | Freq: Every day | VAGINAL | 1 refills | Status: DC

## 2022-11-21 LAB — MATERNIT 21 PLUS CORE, BLOOD
Fetal Fraction: 17
Result (T21): NEGATIVE
Trisomy 13 (Patau syndrome): NEGATIVE
Trisomy 18 (Edwards syndrome): NEGATIVE
Trisomy 21 (Down syndrome): NEGATIVE

## 2022-11-25 ENCOUNTER — Encounter: Payer: Self-pay | Admitting: Certified Nurse Midwife

## 2022-11-25 ENCOUNTER — Telehealth: Payer: Self-pay

## 2022-11-25 ENCOUNTER — Other Ambulatory Visit: Payer: Self-pay | Admitting: Certified Nurse Midwife

## 2022-11-25 DIAGNOSIS — Z3A13 13 weeks gestation of pregnancy: Secondary | ICD-10-CM

## 2022-11-25 DIAGNOSIS — L299 Pruritus, unspecified: Secondary | ICD-10-CM

## 2022-11-25 NOTE — Telephone Encounter (Signed)
Spoke with patient again, discussed the possibility of a peanut allergy and a delayed reaction. She has had delayed food reactions in the past. After discussing with Dr. Marice Potter, patient has been advised to start 1 daily antihistamine for 1 week for relief. Patient is aware and thankful for the help.

## 2022-11-25 NOTE — Telephone Encounter (Signed)
Patient calling in stating she has been experiencing a extremely itchy and dry rash since last Friday. She states on her face and around eyelids having a dry flaky rash along with tight and painful. Reports itchy red and bumpy rash on forearms, thighs and around breasts and between them. She has a history of eczema and has tried A&D ointment, Aveeno lotion and hydrocortisone cream. Denies any new lotions, laundry detergent, fabrics or animals. Reports noticing some possible seasonal allergies, has tried benadryl one night but no relief and no daily allergy medication.  She states she is wondering about any possible vitamin deficiencies based off of a Microbiologist. Please advise patient on current symptoms.

## 2022-12-04 ENCOUNTER — Other Ambulatory Visit: Payer: Managed Care, Other (non HMO)

## 2022-12-04 DIAGNOSIS — L299 Pruritus, unspecified: Secondary | ICD-10-CM

## 2022-12-04 DIAGNOSIS — Z3A13 13 weeks gestation of pregnancy: Secondary | ICD-10-CM

## 2022-12-06 LAB — COMPREHENSIVE METABOLIC PANEL
ALT: 17 [IU]/L (ref 0–32)
AST: 21 [IU]/L (ref 0–40)
Albumin: 4.3 g/dL (ref 4.0–5.0)
Alkaline Phosphatase: 51 [IU]/L (ref 44–121)
BUN/Creatinine Ratio: 20 (ref 9–23)
BUN: 8 mg/dL (ref 6–20)
Bilirubin Total: <0.2 mg/dL (ref 0.0–1.2)
CO2: 18 mmol/L — ABNORMAL LOW (ref 20–29)
Calcium: 9.2 mg/dL (ref 8.7–10.2)
Chloride: 103 mmol/L (ref 96–106)
Creatinine, Ser: 0.4 mg/dL — ABNORMAL LOW (ref 0.57–1.00)
Globulin, Total: 2.2 g/dL (ref 1.5–4.5)
Glucose: 96 mg/dL (ref 70–99)
Potassium: 3.8 mmol/L (ref 3.5–5.2)
Sodium: 136 mmol/L (ref 134–144)
Total Protein: 6.5 g/dL (ref 6.0–8.5)
eGFR: 143 mL/min/{1.73_m2} (ref >59)

## 2022-12-06 LAB — BILE ACIDS, TOTAL: Bile Acids Total: <1 umol/L (ref 0.0–10.0)

## 2022-12-09 ENCOUNTER — Encounter: Payer: Self-pay | Admitting: Certified Nurse Midwife

## 2022-12-13 ENCOUNTER — Encounter: Payer: Self-pay | Admitting: Obstetrics

## 2022-12-13 ENCOUNTER — Ambulatory Visit (INDEPENDENT_AMBULATORY_CARE_PROVIDER_SITE_OTHER): Payer: Managed Care, Other (non HMO) | Admitting: Obstetrics

## 2022-12-13 VITALS — BP 111/85 | HR 84 | Wt 116.0 lb

## 2022-12-13 DIAGNOSIS — Z3689 Encounter for other specified antenatal screening: Secondary | ICD-10-CM

## 2022-12-13 DIAGNOSIS — Z3480 Encounter for supervision of other normal pregnancy, unspecified trimester: Secondary | ICD-10-CM

## 2022-12-13 DIAGNOSIS — Z1379 Encounter for other screening for genetic and chromosomal anomalies: Secondary | ICD-10-CM

## 2022-12-13 DIAGNOSIS — F39 Unspecified mood [affective] disorder: Secondary | ICD-10-CM

## 2022-12-13 DIAGNOSIS — F319 Bipolar disorder, unspecified: Secondary | ICD-10-CM

## 2022-12-13 NOTE — Progress Notes (Signed)
    Return Prenatal Note   Assessment/Plan   Plan  22 y.o. G2P0010 at [redacted]w[redacted]d presents for follow-up OB visit. Reviewed prenatal record including previous visit note.  Mood disorder (HCC) -Reports intrusive thoughts about empathizing with women who have harmed their children. She states that she does not think she would ever do that, but is "impulsive" sometimes -Referral sent to [facility] Mood Disorder. Information given for [facility].  -Contacted Gayland Curry for support -Tina [name] has biweekly counseling set up at [facility] and is working on contacting her psychiatrist for medication management  -Discussed finding social support that she and her baby will be safe  Supervision of other normal pregnancy, antepartum -Anatomy scan ordered -Taking Celexa and Abilify daily, plus hydroxyzine PRN -Feeling some fetal movement   Orders Placed This Encounter  Procedures   US OB Comp + 14 Wk    Standing Status:   Future    Standing Expiration Date:   12/12/2023    Order Specific Question:   Reason for Exam (SYMPTOM  OR DIAGNOSIS REQUIRED)    Answer:   anantomy scan    Order Specific Question:   Preferred Imaging Location?    Answer:   Internal   AFP, Serum, Open Spina Bifida    Standing Status:   Future    Standing Expiration Date:   12/13/2023    Order Specific Question:   Is patient insulin dependent?    Answer:   No    Order Specific Question:   Gestational Age (GA), weeks    Answer:   77    Order Specific Question:   Date on which patient was at this GA    Answer:   12/12/2022    Order Specific Question:   GA Calculation Method    Answer:   LMP    Order Specific Question:   Number of fetuses    Answer:   1   Ambulatory referral to Psychiatry    Referral Priority:   Routine    Referral Type:   Psychiatric    Referral Reason:   Specialty Services Required    Requested Specialty:   Psychiatry    Number of Visits Requested:   1   Return in about 4 weeks (around 01/10/2023).    No future appointments.  For next visit:  continue with routine prenatal care. F/u on psych referral     Subjective   Tina [name] is feeling very unwell. Her sleep is poor and her mood is not good. She is sad, anxious, and stressed, and having intrusive thoughts "sympathizing with women who drowned their children." She states that she knows that is wrong and does not think she would ever do that but feels impulsive sometimes and is concerned. She has poor social support and finances are a strain. 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 She is open to whatever resources are available to her.  Movement: Present Contractions: Not present  Objective   Flow sheet Vitals: Pulse Rate: 84 BP: 111/85 Fetal Heart Rate (bpm): 144 Total weight gain: 1 lb (0.454 kg)  General Appearance  No acute distress, well appearing, and well nourished Pulmonary   Normal work of breathing Neurologic   Alert and oriented to person, place, and time Psychiatric   Mood and affect within normal limits  Tina [name], CNM 12/13/22

## 2022-12-13 NOTE — Assessment & Plan Note (Signed)
-  Anatomy scan ordered -Taking Celexa and Abilify daily, plus hydroxyzine PRN -Feeling some fetal movement

## 2022-12-13 NOTE — Assessment & Plan Note (Signed)
-  Reports intrusive thoughts about empathizing with women who have harmed their children. She states that she does not think she would ever do that, but is "impulsive" sometimes -Referral sent to [facility] Mood Disorder. Information given for [facility].  -Contacted Gayland Curry for support [facility] has biweekly counseling set up at [facility] and is working on contacting her psychiatrist for medication management  -Discussed finding social support that she and her baby will be safe

## 2022-12-16 ENCOUNTER — Other Ambulatory Visit: Payer: Managed Care, Other (non HMO)

## 2022-12-16 DIAGNOSIS — Z1379 Encounter for other screening for genetic and chromosomal anomalies: Secondary | ICD-10-CM

## 2022-12-18 LAB — AFP, SERUM, OPEN SPINA BIFIDA
AFP MoM: 1.13
AFP Value: 44.5 ng/mL
Gest. Age on Collection Date: 16 wk
Maternal Age At EDD: 23.6 a
OSBR Risk 1 IN: 8106
Test Results:: NEGATIVE
Weight: 116 [lb_av]

## 2022-12-19 ENCOUNTER — Encounter: Payer: Self-pay | Admitting: Obstetrics

## 2022-12-31 NOTE — Telephone Encounter (Signed)
Pt was seen 11/15/22 for NOB exam.

## 2023-01-02 ENCOUNTER — Telehealth: Payer: Self-pay

## 2023-01-02 NOTE — Telephone Encounter (Signed)
Patient called to report that she had a low blood pressure reading with lightheadedness and vomiting about 2-3 days ago. Symptoms have resolved. Today her BP is 108/76 with heart rate of 81. She wanted to know if it was normal for her BP to be that low. I advised her that it was normal, as long as it did not drop below 90/50 or above 140/90. She verbalized understanding and said she would continue to watch and log it and bring in a bp log on 10/31.

## 2023-01-07 ENCOUNTER — Telehealth: Payer: Self-pay

## 2023-01-07 NOTE — Telephone Encounter (Signed)
Pt calling; is around 20 weeks; c/o posterior pelvic pain - right at and underneath the tailbone; unable to sit; didn't sleep good last night;  Adv pt per new protocol for back pain.  Pt has appt Fri for f/u anatomy scan.

## 2023-01-08 ENCOUNTER — Ambulatory Visit
Admission: RE | Admit: 2023-01-08 | Discharge: 2023-01-08 | Disposition: A | Discharge status: Home / Self Care | Payer: Managed Care, Other (non HMO) | Source: Ambulatory Visit | Attending: Obstetrics | Admitting: Obstetrics

## 2023-01-08 DIAGNOSIS — Z3689 Encounter for other specified antenatal screening: Secondary | ICD-10-CM | POA: Insufficient documentation

## 2023-01-09 ENCOUNTER — Ambulatory Visit (INDEPENDENT_AMBULATORY_CARE_PROVIDER_SITE_OTHER): Payer: Managed Care, Other (non HMO)

## 2023-01-09 VITALS — BP 101/65 | HR 91 | Wt 118.9 lb

## 2023-01-09 DIAGNOSIS — Z3480 Encounter for supervision of other normal pregnancy, unspecified trimester: Secondary | ICD-10-CM

## 2023-01-09 DIAGNOSIS — F39 Unspecified mood [affective] disorder: Secondary | ICD-10-CM

## 2023-01-09 NOTE — Assessment & Plan Note (Signed)
-   Has seen therapist and psychiatrist recently. Will need to change therapists as she is taking a break from school and current therapist is only available through school. Feels that she is in a good place right now and has resources to find new therapist through insurance. May also check out services at [facility].

## 2023-01-09 NOTE — Assessment & Plan Note (Addendum)
-   Anatomy ultrasound yesterday, report not yet available. Per patient was told everything is normal. - Urine culture collected today for TOC. Has been throwing up antibiotics although nausea is much improved recently.  - Has been using heating pad and using PRN Tylenol for lower back and tail bone pain. Discussed pelvic floor physical therapy or chiropractor which she is thinking about exploring.  - Declined flu vaccine due to egg allergy.  - Reviewed red flag warning signs anticipatory guidance for upcoming prenatal care.

## 2023-01-09 NOTE — Progress Notes (Signed)
    Return Prenatal Note   Assessment/Plan   Plan  22 y.o. G2P0010 at [redacted]w[redacted]d presents for follow-up OB visit. Reviewed prenatal record including previous visit note.  Supervision of other normal pregnancy, antepartum - Anatomy ultrasound yesterday, report not yet available. Per patient was told everything is normal. - Urine culture collected today for TOC. Has been throwing up antibiotics although nausea is much improved recently.  - Has been using heating pad and using PRN Tylenol for lower back and tail bone pain. Discussed pelvic floor physical therapy or chiropractor which she is thinking about exploring.  - Declined flu vaccine due to egg allergy.  - Reviewed red flag warning signs anticipatory guidance for upcoming prenatal care.   Mood disorder [facility]) - Has seen therapist and psychiatrist recently. Will need to change therapists as she is taking a break from school and current therapist is only available through school. Feels that she is in a good place right now and has resources to find new therapist through insurance. May also check out services at [facility].    Orders Placed This Encounter  Procedures   Culture, OB Urine   Return in about 4 weeks (around 02/06/2023) for ROB.   Future Appointments  Date Time Provider Department Center  02/05/2023 10:35 AM Julieanne Manson, MD [facility] None    For next visit:  continue with routine prenatal care     Subjective   22 y.o. G2P0010 at [redacted]w[redacted]d presents for this follow-up prenatal visit.  Patient has been having ongoing lower back and tail bone pain.  Patient reports: Movement: Present Contractions: Not present  Objective   Flow sheet Vitals: Pulse Rate: 91 BP: 101/65 Fundal Height: 20 cm Fetal Heart Rate (bpm): 160 Total weight gain: 3 lb 14.4 oz (1.769 kg)  General Appearance  No acute distress, well appearing, and well nourished Pulmonary   Normal work of breathing Neurologic   Alert and oriented to  person, place, and time Psychiatric   Mood and affect within normal limits  Lindalou Hose Alliyah Roesler, CNM  10/31/249:59 AM

## 2023-01-10 ENCOUNTER — Encounter: Payer: Self-pay | Admitting: Obstetrics

## 2023-01-11 LAB — CULTURE, OB URINE

## 2023-01-11 LAB — URINE CULTURE, OB REFLEX

## 2023-02-05 ENCOUNTER — Encounter: Payer: Self-pay | Admitting: Obstetrics

## 2023-02-05 ENCOUNTER — Ambulatory Visit: Payer: Managed Care, Other (non HMO) | Admitting: Obstetrics

## 2023-02-05 VITALS — BP 120/70 | HR 88 | Wt 132.0 lb

## 2023-02-05 DIAGNOSIS — O0972 Supervision of high risk pregnancy due to social problems, second trimester: Secondary | ICD-10-CM | POA: Diagnosis not present

## 2023-02-05 DIAGNOSIS — Z3A23 23 weeks gestation of pregnancy: Secondary | ICD-10-CM | POA: Diagnosis not present

## 2023-02-05 NOTE — Progress Notes (Signed)
    Return Prenatal Note   Subjective  22 y.o. G2P0010 at [redacted]w[redacted]d presents for this follow-up prenatal visit. Pregnancy notable for bipolar, schizoaffective and other mood disorders with hx of SI and hospitalization, asthma, HSV2.   Patient today is very upset with her care at this practice. Has been coming here as a GYN patient when it was [facility] and is upset the original [facility] physicians no longer practice here. Expresses dissatisfaction with [facility] hospital during her grandmother's experiences and states her intent to transfer prenatal care to [facility] and delivery at [place]. Unclear if pt has scheduled with them or not.   Also states she has not found a new therapist for her mental health disorders. She has spoken with our referral coordinator several times and states none of the referral sources take her insurance. Denies SI/HI today. Continues on Abilify and Celexa through a telehealth psychiatrist for medication management and states she feels "stable and normal" today.   Patient reports: Movement: Present Contractions: Not present Denies vaginal bleeding or leaking fluid. Objective  Flow sheet Vitals: Pulse Rate: 88 BP: 120/70 Fundal Height: 24 cm Fetal Heart Rate (bpm): 160 Total weight gain: 17 lb (7.711 kg)  General Appearance  No acute distress, well appearing, and well nourished Pulmonary   Normal work of breathing Neurologic   Alert and oriented to person, place, and time Psychiatric   Agitated, speech mildly pressured, anxious and upset  Assessment/Plan   Plan  22 y.o. G2P0010 at [redacted]w[redacted]d by LMP=7wk [place] presents for follow-up OB visit. Reviewed prenatal record including previous visit note.  1. Suprvsn of high risk preg due to social problems, second tri -Had lengthy discussion today about where patient would like to continue her prenatal care. Encouraged her to give [place] an opportunity to earn back her trust and continue care with [place]. Patient was very agitated  and insistent that she is transferring. Will have our practice administrator reach out and ensure the transition is as smooth as possible.  -Will order her 28wk labs now and discussed instructions for glucola if she decides to continue care here.  -Will reach out to referral coordinator re: therapy referral. Concerned for pt's extensive mental health history. Encouraged her to stay on her medications and reach out to her telehealth psychiatrist while we are still trying to find her new therapist.  -Can see in system that pt is scheduled to see Dr. Shawnie Pons on 12/18, will follow up to ensure no gaps in her care.    Return in about 4 weeks (around 03/05/2023) for rob and 1hour glucose .   Future Appointments  Date Time Provider Department Center  02/26/2023  2:50 PM Reva Bores, MD [facility]  03/14/2023  8:35 AM [facility]  03/14/2023  9:35 AM Federico Flake, MD [facility]  03/26/2023 10:35 AM Reva Bores, MD [facility]  04/02/2023 10:00 AM Dana Allan, MD [facility]  04/09/2023 10:35 AM Reva Bores, MD [facility]   For next visit:  ROB with 1 hour gluocla, third trimester labs, and Tdap  Total time was 35 minutes. That includes chart review before the visit, the actual patient visit, and time spent on documentation after the visit. Time excludes procedures, if any.     Julieanne Manson, DO [facility]

## 2023-02-05 NOTE — Patient Instructions (Signed)
Oral Glucose Tolerance Test During Pregnancy Why am I having this test? The oral glucose tolerance test (OGTT) is done to check how your body processes blood sugar (glucose). This is one of several tests used to diagnose diabetes that develops during pregnancy (gestational diabetes mellitus). Gestational diabetes is a short-term form of diabetes that some women develop while they are pregnant. It usually occurs during the second trimester of pregnancy and goes away after delivery. Testing, or screening, for gestational diabetes usually occurs at weeks 24-28 of pregnancy. You may have the OGTT test after having a 1-hour glucose screening test if the results from that test indicate that you may have gestational diabetes. This test may also be needed if: You have a history of gestational diabetes. There is a history of giving birth to very large babies or of losing pregnancies (having stillbirths). You have signs and symptoms of diabetes, such as: Changes in your eyesight. Tingling or numbness in your hands or feet. Changes in hunger, thirst, and urination, and these are not explained by your pregnancy. What is being tested? This test measures the amount of glucose in your blood at different times during a period of 3 hours. This shows how well your body can process glucose. What kind of sample is taken?  Blood samples are required for this test. They are usually collected by inserting a needle into a blood vessel. How do I prepare for this test? For 3 days before your test, eat normally. Have plenty of carbohydrate-rich foods. Follow instructions from your health care provider about: Eating or drinking restrictions on the day of the test. You may be asked not to eat or drink anything other than water (to fast) starting 8-10 hours before the test. Changing or stopping your regular medicines. Some medicines may interfere with this test. Tell a health care provider about: All medicines you are  taking, including vitamins, herbs, eye drops, creams, and over-the-counter medicines. Any blood disorders you have. Any surgeries you have had. Any medical conditions you have. What happens during the test? First, your blood glucose will be measured. This is referred to as your fasting blood glucose because you fasted before the test. Then, you will drink a glucose solution that contains a certain amount of glucose. Your blood glucose will be measured again 1, 2, and 3 hours after you drink the solution. This test takes about 3 hours to complete. You will need to stay at the testing location during this time. During the testing period: Do not eat or drink anything other than the glucose solution. Do not exercise. Do not use any products that contain nicotine or tobacco, such as cigarettes, e-cigarettes, and chewing tobacco. These can affect your test results. If you need help quitting, ask your health care provider. The testing procedure may vary among health care providers and hospitals. How are the results reported? Your results will be reported as milligrams of glucose per deciliter of blood (mg/dL) or millimoles per liter (mmol/L). There is more than one source for screening and diagnosis reference values used to diagnose gestational diabetes. Your health care provider will compare your results to normal values that were established after testing a large group of people (reference values). Reference values may vary among labs and hospitals. For this test (Carpenter-Coustan), reference values are: Fasting: 95 mg/dL (5.3 mmol/L). 1 hour: 180 mg/dL (09.8 mmol/L). 2 hour: 155 mg/dL (8.6 mmol/L). 3 hour: 140 mg/dL (7.8 mmol/L). What do the results mean? Results below the reference values are  considered normal. If two or more of your blood glucose levels are at or above the reference values, you may be diagnosed with gestational diabetes. If only one level is high, your health care provider may  suggest repeat testing or other tests to confirm a diagnosis. Talk with your health care provider about what your results mean. Questions to ask your health care provider Ask your health care provider, or the department that is doing the test: When will my results be ready? How will I get my results? What are my treatment options? What other tests do I need? What are my next steps? Summary The oral glucose tolerance test (OGTT) is one of several tests used to diagnose diabetes that develops during pregnancy (gestational diabetes mellitus). Gestational diabetes is a short-term form of diabetes that some women develop while they are pregnant. You may have the OGTT test after having a 1-hour glucose screening test if the results from that test show that you may have gestational diabetes. You may also have this test if you have any symptoms or risk factors for this type of diabetes. Talk with your health care provider about what your results mean. This information is not intended to replace advice given to you by your health care provider. Make sure you discuss any questions you have with your health care provider. Document Revised: 10/02/2021 Document Reviewed: 08/05/2019 Elsevier Patient Education  2024 [employer].

## 2023-02-26 ENCOUNTER — Ambulatory Visit (INDEPENDENT_AMBULATORY_CARE_PROVIDER_SITE_OTHER): Payer: Managed Care, Other (non HMO) | Admitting: Family Medicine

## 2023-02-26 VITALS — BP 103/64 | HR 94 | Wt 134.0 lb

## 2023-02-26 DIAGNOSIS — O0972 Supervision of high risk pregnancy due to social problems, second trimester: Secondary | ICD-10-CM | POA: Diagnosis not present

## 2023-02-26 DIAGNOSIS — Z1339 Encounter for screening examination for other mental health and behavioral disorders: Secondary | ICD-10-CM | POA: Diagnosis not present

## 2023-02-26 DIAGNOSIS — J4540 Moderate persistent asthma, uncomplicated: Secondary | ICD-10-CM

## 2023-02-26 DIAGNOSIS — J45909 Unspecified asthma, uncomplicated: Secondary | ICD-10-CM | POA: Insufficient documentation

## 2023-02-26 DIAGNOSIS — O99342 Other mental disorders complicating pregnancy, second trimester: Secondary | ICD-10-CM

## 2023-02-26 DIAGNOSIS — O98512 Other viral diseases complicating pregnancy, second trimester: Secondary | ICD-10-CM

## 2023-02-26 DIAGNOSIS — Z3A26 26 weeks gestation of pregnancy: Secondary | ICD-10-CM | POA: Diagnosis not present

## 2023-02-26 DIAGNOSIS — B009 Herpesviral infection, unspecified: Secondary | ICD-10-CM

## 2023-02-26 DIAGNOSIS — F319 Bipolar disorder, unspecified: Secondary | ICD-10-CM

## 2023-02-26 NOTE — Progress Notes (Signed)
Transfer OB  CC: tail bone discomfort , hard to sit at times  per pt notes tail bone popping at time, using heating pad and tylenol.  Patient vocal about mental struggles , currently seeing behavioral health, in process of getting a therapist. pt states she is doing ok and notes medication management.

## 2023-02-26 NOTE — Patient Instructions (Signed)
Please remember nothing to eat or drink after midnight, prior to the next office visit.

## 2023-02-26 NOTE — Progress Notes (Signed)
   PRENATAL VISIT NOTE  Subjective:  Tina [name] is a 22 y.o. G2P0010 at [redacted]w[redacted]d being seen today for transferring prenatal care from [facility].  She is currently monitored for the following issues for this low-risk pregnancy and has Obsessive-compulsive disorder; Other specified anxiety disorders; Mood disorder (HCC); Other insomnia; Bipolar 1 disorder (HCC); Acute cystitis with hematuria; Exposure to genital herpes; Suprvsn of high risk preg due to social problems, second tri; HSV-2 infection complicating pregnancy, unspecified trimester; and Asthma on their problem list.  Patient reports no complaints.  Contractions: Not present. Vag. Bleeding: None.  Movement: Present. Denies leaking of fluid.   The following portions of the patient's history were reviewed and updated as appropriate: allergies, current medications, past family history, past medical history, past social history, past surgical history and problem list.   Objective:   Vitals:   02/26/23 1442  BP: 103/64  Pulse: 94  Weight: 134 lb (60.8 kg)    Fetal Status: Fetal Heart Rate (bpm): 143   Movement: Present     General:  Alert, oriented and cooperative. Patient is in no acute distress.  Skin: Skin is warm and dry. No rash noted.   Cardiovascular: Normal heart rate noted  Respiratory: Normal respiratory effort, no problems with respiration noted  Abdomen: Soft, gravid, appropriate for gestational age.  Pain/Pressure: Present     Pelvic: Cervical exam deferred        Extremities: Normal range of motion.  Edema: Trace  Mental Status: Normal mood and affect. Normal behavior. Normal judgment and thought content.   Assessment and Plan:  Pregnancy: G2P0010 at [redacted]w[redacted]d 1. Suprvsn of high risk preg due to social problems, second tri (Primary) Doing well.  2. [redacted] weeks gestation of pregnancy   3. 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 Bipolar 1 disorder (HCC) On Abilify and Celexa,  and doing well  5. Moderate persistent asthma without complication Has her inhalers. Still with some SOB. Has seen pulmonary  Preterm labor symptoms and general obstetric precautions including but not limited to vaginal bleeding, contractions, leaking of fluid and fetal movement were reviewed in detail with the patient. Please refer to After Visit Summary for other counseling recommendations.   Return in 2 weeks (on 03/12/2023) for 28 wk labs.  Future Appointments  Date Time Provider Department Center  03/14/2023  8:35 AM [facility]  03/14/2023  9:35 AM Federico Flake, MD [facility]  03/26/2023 10:35 AM Reva Bores, MD [facility]  04/02/2023 10:00 AM Dana Allan, MD [facility]  04/09/2023 10:35 AM Reva Bores, MD [facility]    Reva Bores, MD

## 2023-03-12 NOTE — L&D Delivery Note (Signed)
   Delivery Note:   G2P0010 at [redacted]w[redacted]d  Admitting diagnosis: Fetal growth restriction antepartum [O36.5990] Risks:  Patient Active Problem List   Diagnosis Date Noted   Schizoaffective disorder, bipolar type (HCC) 05/23/2023   Fetal growth restriction - RESOLVED 05/05/2023   Asthma 02/26/2023   HSV-2 infection complicating pregnancy, unspecified trimester 11/17/2022   Suprvsn of high risk preg due to social problems, second tri 10/07/2022   Obsessive-compulsive disorder 02/26/2018     First Stage:  Induction of labor: 05/23/23 0930 Onset of labor: 0930 Augmentation: Cytotec ROM: SROM @ 0930 Active labor onset: 0930 Analgesia /Anesthesia/Pain control intrapartum: None  Second Stage:  Complete dilation at 05/23/2023  1251 Onset of pushing at 1251 FHR second stage Cat II. Variable decles with contractions    CNM called to patient bedside. Patient Pushing in Hands and Knees position with CNM and L&D staff support at bedside.   Delivery of a Live born female  Birth Weight:  PENDING  APGAR: 8, 9  Newborn Delivery   Birth date/time: 05/23/2023 12:58:00 Delivery type: Vaginal, Spontaneous     With great maternal pushing efforts fetal head delivered in cephalic presentation, position DOA position and spontaneously restituted to LOA . Nuchal Cord: Yes    After several mins of life cord double clamped after cessation of pulsation, and cut by Mother of the patient .  Collection of cord blood for typing completed. Cord blood donation-None Arterial cord blood sample-No   Third Stage:  Placenta delivered-Spontaneous with 3 vessels. Uterine tone firm bleeding scant Uterotonics: IM pit pitocin  Placenta to L&D for disposal .  Bilateral labia/periurethral laceration identified. Episiotomy:None Local analgesia: 1% lidocaine   Repair: Left labial repaired with a 4.0 vicryl.   to hemostasis, right hemostatic and not repaired  Est. Blood Loss (mL):175 Complications:  None   Mom to postpartum.  Baby boy "EJ" to Couplet care / Skin to Skin.  Delivery Report:  Review the Delivery Report for details.    Jaena Brocato Danella Deis) Suzie Portela, MSN, CNM  [facility]  05/23/23 1:49 PM

## 2023-03-14 ENCOUNTER — Encounter: Payer: Self-pay | Admitting: Family Medicine

## 2023-03-14 ENCOUNTER — Other Ambulatory Visit: Payer: Managed Care, Other (non HMO)

## 2023-03-14 ENCOUNTER — Ambulatory Visit (INDEPENDENT_AMBULATORY_CARE_PROVIDER_SITE_OTHER): Payer: Managed Care, Other (non HMO) | Admitting: Family Medicine

## 2023-03-14 VITALS — BP 105/69 | HR 83 | Wt 130.4 lb

## 2023-03-14 DIAGNOSIS — O0972 Supervision of high risk pregnancy due to social problems, second trimester: Secondary | ICD-10-CM

## 2023-03-14 DIAGNOSIS — B009 Herpesviral infection, unspecified: Secondary | ICD-10-CM

## 2023-03-14 DIAGNOSIS — F259 Schizoaffective disorder, unspecified: Secondary | ICD-10-CM | POA: Insufficient documentation

## 2023-03-14 DIAGNOSIS — O98519 Other viral diseases complicating pregnancy, unspecified trimester: Secondary | ICD-10-CM

## 2023-03-14 NOTE — Progress Notes (Signed)
 ROB: wants to discuss Water birth  Declines tdap today

## 2023-03-14 NOTE — Progress Notes (Signed)
   PRENATAL VISIT NOTE  Subjective:  Tina [name] is a 23 y.o. G2P0010 at [redacted]w[redacted]d being seen today for ongoing prenatal care.  She is currently monitored for the following issues for this high-risk pregnancy and has Obsessive-compulsive disorder; Bipolar 1 disorder (HCC); Acute cystitis with hematuria; Suprvsn of high risk preg due to social problems, second tri; HSV-2 infection complicating pregnancy, unspecified trimester; Asthma; and Schizoaffective disorder, chronic condition (HCC) on their problem list.  Patient reports  seeing psych NP -- denies intrusive thoughts currently .  Contractions: Not present. Vag. Bleeding: None.  Movement: Present. Denies leaking of fluid.   The following portions of the patient's history were reviewed and updated as appropriate: allergies, current medications, past family history, past medical history, past social history, past surgical history and problem list.   Objective:   Vitals:   03/14/23 0911  BP: 105/69  Pulse: 83  Weight: 130 lb 6.4 oz (59.1 kg)    Fetal Status: Fetal Heart Rate (bpm): 131 Fundal Height: 29 cm Movement: Present     General:  Alert, oriented and cooperative. Patient is in no acute distress.  Skin: Skin is warm and dry. No rash noted.   Cardiovascular: Normal heart rate noted  Respiratory: Normal respiratory effort, no problems with respiration noted  Abdomen: Soft, gravid, appropriate for gestational age.  Pain/Pressure: Absent     Pelvic: Cervical exam deferred        Extremities: Normal range of motion.  Edema: None  Mental Status: Normal mood and affect. Normal behavior. Normal judgment and thought content.   Assessment and Plan:  Pregnancy: G2P0010 at [redacted]w[redacted]d  1. Suprvsn of high risk preg due to social problems, second tri (Primary) Up to date FH appropriate Completing GTT  2. Schizoaffective disorder, chronic condition (HCC) Pyschiatrist- [facility]) Perinatal pysch NP: Inocente Labs NP Currently Abilify  and Citalopram  No intrusive thoughts currently  3. HSV-2 infection complicating pregnancy, unspecified trimester PPX at 33  Preterm labor symptoms and general obstetric precautions including but not limited to vaginal bleeding, contractions, leaking of fluid and fetal movement were reviewed in detail with the patient. Please refer to After Visit Summary for other counseling recommendations.   Return in about 2 weeks (around 03/28/2023) for Routine prenatal care.  Future Appointments  Date Time Provider Department Center  03/26/2023 10:35 AM Fredirick Glenys RAMAN, MD [facility]  04/02/2023 10:00 AM Lavonia Glenys, MD [facility]  04/09/2023 10:35 AM Fredirick Glenys RAMAN, MD [facility]  04/23/2023 10:35 AM Fredirick Glenys RAMAN, MD [facility]  04/30/2023 10:35 AM Fredirick Glenys RAMAN, MD [facility]  05/07/2023 10:35 AM Fredirick Glenys RAMAN, MD [facility]    Suzen Maryan Masters, MD

## 2023-03-15 LAB — CBC
Hematocrit: 32.7 % — ABNORMAL LOW (ref 34.0–46.6)
Hemoglobin: 10.6 g/dL — ABNORMAL LOW (ref 11.1–15.9)
MCH: 28.8 pg (ref 26.6–33.0)
MCHC: 32.4 g/dL (ref 31.5–35.7)
MCV: 89 fL (ref 79–97)
Platelets: 315 10*3/uL (ref 150–450)
RBC: 3.68 x10E6/uL — ABNORMAL LOW (ref 3.77–5.28)
RDW: 12.9 % (ref 11.7–15.4)
WBC: 8.6 10*3/uL (ref 3.4–10.8)

## 2023-03-15 LAB — RPR: RPR Ser Ql: NONREACTIVE

## 2023-03-15 LAB — GLUCOSE TOLERANCE, 2 HOURS W/ 1HR
Glucose, 1 hour: 119 mg/dL (ref 70–179)
Glucose, 2 hour: 115 mg/dL (ref 70–152)
Glucose, Fasting: 76 mg/dL (ref 70–91)

## 2023-03-15 LAB — HIV ANTIBODY (ROUTINE TESTING W REFLEX): HIV Screen 4th Generation wRfx: NONREACTIVE

## 2023-03-26 ENCOUNTER — Ambulatory Visit (INDEPENDENT_AMBULATORY_CARE_PROVIDER_SITE_OTHER): Payer: Managed Care, Other (non HMO) | Admitting: Family Medicine

## 2023-03-26 VITALS — BP 119/78 | HR 105 | Wt 132.4 lb

## 2023-03-26 DIAGNOSIS — O0972 Supervision of high risk pregnancy due to social problems, second trimester: Secondary | ICD-10-CM

## 2023-03-26 DIAGNOSIS — B009 Herpesviral infection, unspecified: Secondary | ICD-10-CM

## 2023-03-26 DIAGNOSIS — F319 Bipolar disorder, unspecified: Secondary | ICD-10-CM

## 2023-03-26 DIAGNOSIS — Z3A30 30 weeks gestation of pregnancy: Secondary | ICD-10-CM

## 2023-03-26 DIAGNOSIS — O98519 Other viral diseases complicating pregnancy, unspecified trimester: Secondary | ICD-10-CM

## 2023-03-26 NOTE — Progress Notes (Signed)
 ROB: Right rib cage pain, and pelvic pain

## 2023-03-26 NOTE — Progress Notes (Signed)
   PRENATAL VISIT NOTE  Subjective:  Tina [name] is a 23 y.o. G2P0010 at [redacted]w[redacted]d being seen today for ongoing prenatal care.  She is currently monitored for the following issues for this low-risk pregnancy and has Obsessive-compulsive disorder; Bipolar 1 disorder (HCC); Acute cystitis with hematuria; Suprvsn of high risk preg due to social problems, second tri; HSV-2 infection complicating pregnancy, unspecified trimester; Asthma; and Schizoaffective disorder, chronic condition (HCC) on their problem list.  Patient reports no complaints.  Contractions: Not present. Vag. Bleeding: None.  Movement: Present. Denies leaking of fluid.   The following portions of the patient's history were reviewed and updated as appropriate: allergies, current medications, past family history, past medical history, past social history, past surgical history and problem list.   Objective:   Vitals:   03/26/23 1043  BP: 119/78  Pulse: (!) 105  Weight: 132 lb 6.4 oz (60.1 kg)    Fetal Status: Fetal Heart Rate (bpm): 142 Fundal Height: 30 cm Movement: Present     General:  Alert, oriented and cooperative. Patient is in no acute distress.  Skin: Skin is warm and dry. No rash noted.   Cardiovascular: Normal heart rate noted  Respiratory: Normal respiratory effort, no problems with respiration noted  Abdomen: Soft, gravid, appropriate for gestational age.  Pain/Pressure: Present     Pelvic: Cervical exam deferred        Extremities: Normal range of motion.  Edema: Trace (nightly)  Mental Status: Normal mood and affect. Normal behavior. Normal judgment and thought content.   Assessment and Plan:  Pregnancy: G2P0010 at [redacted]w[redacted]d 1. Suprvsn of high risk preg due to social problems, second tri (Primary) Continue routine prenatal care.  2. [redacted] weeks gestation of pregnancy   3. Bipolar 1 disorder (HCC) On Abilify  and Celexa  Has psych  4. HSV-2 infection complicating pregnancy, unspecified trimester On  Valtrex   Preterm labor symptoms and general obstetric precautions including but not limited to vaginal bleeding, contractions, leaking of fluid and fetal movement were reviewed in detail with the patient. Please refer to After Visit Summary for other counseling recommendations.   Return in 2 weeks (on 04/09/2023).  Future Appointments  Date Time Provider Department Center  04/02/2023 10:00 AM Valli Gaw, MD [facility]  04/09/2023 10:35 AM Granville Layer, MD [facility]  04/23/2023 10:35 AM Granville Layer, MD [facility]  04/30/2023 10:35 AM Granville Layer, MD [facility]  05/07/2023 10:35 AM Granville Layer, MD [facility]    Granville Layer, MD

## 2023-04-02 ENCOUNTER — Ambulatory Visit: Payer: Managed Care, Other (non HMO) | Admitting: Family Medicine

## 2023-04-09 ENCOUNTER — Ambulatory Visit (INDEPENDENT_AMBULATORY_CARE_PROVIDER_SITE_OTHER): Payer: Managed Care, Other (non HMO) | Admitting: Family Medicine

## 2023-04-09 VITALS — BP 107/68 | HR 96 | Wt 140.0 lb

## 2023-04-09 DIAGNOSIS — O0972 Supervision of high risk pregnancy due to social problems, second trimester: Secondary | ICD-10-CM

## 2023-04-09 DIAGNOSIS — F259 Schizoaffective disorder, unspecified: Secondary | ICD-10-CM

## 2023-04-09 DIAGNOSIS — F319 Bipolar disorder, unspecified: Secondary | ICD-10-CM

## 2023-04-09 DIAGNOSIS — O99343 Other mental disorders complicating pregnancy, third trimester: Secondary | ICD-10-CM

## 2023-04-09 DIAGNOSIS — O98519 Other viral diseases complicating pregnancy, unspecified trimester: Secondary | ICD-10-CM

## 2023-04-09 DIAGNOSIS — Z3A32 32 weeks gestation of pregnancy: Secondary | ICD-10-CM

## 2023-04-09 DIAGNOSIS — B009 Herpesviral infection, unspecified: Secondary | ICD-10-CM

## 2023-04-09 NOTE — Progress Notes (Signed)
Increase agitation and irritability -did talk to psychiatrist and they started her back on atarax

## 2023-04-09 NOTE — Progress Notes (Signed)
   PRENATAL VISIT NOTE  Subjective:  Tina [name] is a 23 y.o. G2P0010 at [redacted]w[redacted]d being seen today for ongoing prenatal care.  She is currently monitored for the following issues for this low-risk pregnancy and has Obsessive-compulsive disorder; Bipolar 1 disorder (HCC); Acute cystitis with hematuria; Suprvsn of high risk preg due to social problems, second tri; HSV-2 infection complicating pregnancy, unspecified trimester; Asthma; and Schizoaffective disorder, chronic condition (HCC) on their problem list.  Patient reports  increasing anxiety .  Contractions: Not present. Vag. Bleeding: None.  Movement: Present. Denies leaking of fluid.   The following portions of the patient's history were reviewed and updated as appropriate: allergies, current medications, past family history, past medical history, past social history, past surgical history and problem list.   Objective:   Vitals:   04/09/23 1050  BP: 107/68  Pulse: 96  Weight: 140 lb (63.5 kg)    Fetal Status: Fetal Heart Rate (bpm): 147 Fundal Height: 31 cm Movement: Present     General:  Alert, oriented and cooperative. Patient is in no acute distress.  Skin: Skin is warm and dry. No rash noted.   Cardiovascular: Normal heart rate noted  Respiratory: Normal respiratory effort, no problems with respiration noted  Abdomen: Soft, gravid, appropriate for gestational age.  Pain/Pressure: Absent     Pelvic: Cervical exam deferred        Extremities: Normal range of motion.     Mental Status: Normal mood and affect. Normal behavior. Normal judgment and thought content.   Assessment and Plan:  Pregnancy: G2P0010 at [redacted]w[redacted]d 1. Suprvsn of high risk preg due to social problems, second tri (Primary) Continue routine prenatal care.  2. Bipolar 1 disorder (HCC) On Abilify   3. Schizoaffective disorder, chronic condition (HCC) On Abilify and Celexa Added hydroxyzine prn  4. HSV-2 infection complicating pregnancy, unspecified trimester On  Valtrex, though has trouble keeping this down. No h/o actual outbreak that sheis aware of  5. [redacted] weeks gestation of pregnancy   Preterm labor symptoms and general obstetric precautions including but not limited to vaginal bleeding, contractions, leaking of fluid and fetal movement were reviewed in detail with the patient. Please refer to After Visit Summary for other counseling recommendations.   Return in 2 weeks (on 04/23/2023).  Future Appointments  Date Time Provider Department Center  04/23/2023 10:35 AM Reva Bores, MD [facility]  04/30/2023 10:35 AM Reva Bores, MD [facility]  05/02/2023  8:35 AM Federico Flake, MD [facility]  07/23/2023  8:40 AM Dana Allan, MD [facility]    Reva Bores, MD

## 2023-04-23 ENCOUNTER — Ambulatory Visit (INDEPENDENT_AMBULATORY_CARE_PROVIDER_SITE_OTHER): Payer: Managed Care, Other (non HMO) | Admitting: Family Medicine

## 2023-04-23 VITALS — BP 114/78 | HR 85 | Wt 145.0 lb

## 2023-04-23 DIAGNOSIS — O98519 Other viral diseases complicating pregnancy, unspecified trimester: Secondary | ICD-10-CM

## 2023-04-23 DIAGNOSIS — O0972 Supervision of high risk pregnancy due to social problems, second trimester: Secondary | ICD-10-CM

## 2023-04-23 DIAGNOSIS — B009 Herpesviral infection, unspecified: Secondary | ICD-10-CM

## 2023-04-23 DIAGNOSIS — F259 Schizoaffective disorder, unspecified: Secondary | ICD-10-CM

## 2023-04-23 DIAGNOSIS — F319 Bipolar disorder, unspecified: Secondary | ICD-10-CM

## 2023-04-23 NOTE — Progress Notes (Signed)
ROB  CC: round ligament pain, lower pelvis pain radiates to left side. Pt declines exam today Notes appt soon with Dr.Newton /Class desires water birth.

## 2023-04-23 NOTE — Progress Notes (Signed)
   PRENATAL VISIT NOTE  Subjective:  Tina [name] is a 23 y.o. G2P0010 at [redacted]w[redacted]d being seen today for ongoing prenatal care.  She is currently monitored for the following issues for this low-risk pregnancy and has Obsessive-compulsive disorder; Bipolar 1 disorder (HCC); Acute cystitis with hematuria; Suprvsn of high risk preg due to social problems, second tri; HSV-2 infection complicating pregnancy, unspecified trimester; Asthma; and Schizoaffective disorder, chronic condition (HCC) on their problem list.  Patient reports no complaints.  Contractions: Not present. Vag. Bleeding: None.  Movement: Present. Denies leaking of fluid.   The following portions of the patient's history were reviewed and updated as appropriate: allergies, current medications, past family history, past medical history, past social history, past surgical history and problem list.   Objective:   Vitals:   04/23/23 1018  BP: 114/78  Pulse: 85  Weight: 145 lb (65.8 kg)    Fetal Status: Fetal Heart Rate (bpm): 156 Fundal Height: 32 cm Movement: Present     General:  Alert, oriented and cooperative. Patient is in no acute distress.  Skin: Skin is warm and dry. No rash noted.   Cardiovascular: Normal heart rate noted  Respiratory: Normal respiratory effort, no problems with respiration noted  Abdomen: Soft, gravid, appropriate for gestational age.  Pain/Pressure: Present     Pelvic: Cervical exam deferred        Extremities: Normal range of motion.  Edema: Trace  Mental Status: Normal mood and affect. Normal behavior. Normal judgment and thought content.   Assessment and Plan:  Pregnancy: G2P0010 at [redacted]w[redacted]d 1. Suprvsn of high risk preg due to social problems, second tri (Primary) Check presentation next visit  2. Schizoaffective disorder, chronic condition (HCC) On Abilify and Celexa  3. HSV-2 infection complicating pregnancy, unspecified trimester On Valtrex  4. Bipolar 1 disorder (HCC) On meds,  stable  Preterm labor symptoms and general obstetric precautions including but not limited to vaginal bleeding, contractions, leaking of fluid and fetal movement were reviewed in detail with the patient. Please refer to After Visit Summary for other counseling recommendations.   Return in 2 weeks (on 05/07/2023).  Future Appointments  Date Time Provider Department Center  05/02/2023  8:35 AM Federico Flake, MD [facility]  05/07/2023  9:35 AM Reva Bores, MD [facility]  05/14/2023 10:35 AM Reva Bores, MD [facility]  05/21/2023 10:55 AM Dudley Bing, MD [facility]  05/28/2023 10:55 AM Reva Bores, MD [facility]  07/23/2023  8:40 AM Dana Allan, MD [facility]    Reva Bores, MD

## 2023-04-30 ENCOUNTER — Encounter: Payer: Managed Care, Other (non HMO) | Admitting: Family Medicine

## 2023-05-02 ENCOUNTER — Other Ambulatory Visit (HOSPITAL_COMMUNITY)
Admission: RE | Admit: 2023-05-02 | Discharge: 2023-05-02 | Disposition: A | Discharge status: Home / Self Care | Payer: Managed Care, Other (non HMO) | Source: Ambulatory Visit | Attending: Family Medicine | Admitting: Family Medicine

## 2023-05-02 ENCOUNTER — Ambulatory Visit: Payer: Managed Care, Other (non HMO) | Admitting: Family Medicine

## 2023-05-02 VITALS — BP 115/80 | HR 88 | Wt 144.0 lb

## 2023-05-02 DIAGNOSIS — F259 Schizoaffective disorder, unspecified: Secondary | ICD-10-CM

## 2023-05-02 DIAGNOSIS — O0972 Supervision of high risk pregnancy due to social problems, second trimester: Secondary | ICD-10-CM

## 2023-05-02 DIAGNOSIS — O26843 Uterine size-date discrepancy, third trimester: Secondary | ICD-10-CM

## 2023-05-02 DIAGNOSIS — Z3A36 36 weeks gestation of pregnancy: Secondary | ICD-10-CM

## 2023-05-02 NOTE — Progress Notes (Signed)
PRENATAL VISIT NOTE  Subjective:  Tina [name] is a 23 y.o. G2P0010 at [redacted]w[redacted]d being seen today for ongoing prenatal care.  She is currently monitored for the following issues for this high-risk pregnancy and has Obsessive-compulsive disorder; Bipolar 1 disorder (HCC); Acute cystitis with hematuria; Suprvsn of high risk preg due to social problems, second tri; HSV-2 infection complicating pregnancy, unspecified trimester; Asthma; and Schizoaffective disorder, chronic condition (HCC) on their problem list.  Patient reports no complaints.  Contractions: Irritability. Vag. Bleeding: None.  Movement: Present. Denies leaking of fluid.   The following portions of the patient's history were reviewed and updated as appropriate: allergies, current medications, past family history, past medical history, past social history, past surgical history and problem list.   Objective:   Vitals:   05/02/23 0831  BP: 115/80  Pulse: 88  Weight: 144 lb (65.3 kg)    Fetal Status: Fetal Heart Rate (bpm): 147 Fundal Height: 32 cm Movement: Present  Presentation: Vertex  General:  Alert, oriented and cooperative. Patient is in no acute distress.  Skin: Skin is warm and dry. No rash noted.   Cardiovascular: Normal heart rate noted  Respiratory: Normal respiratory effort, no problems with respiration noted  Abdomen: Soft, gravid, appropriate for gestational age.  Pain/Pressure: Present     Pelvic: Cervical exam deferred        Extremities: Normal range of motion.  Edema: Trace  Mental Status: Normal mood and affect. Normal behavior. Normal judgment and thought content.   Assessment and Plan:  Pregnancy: G2P0010 at [redacted]w[redacted]d 1. Suprvsn of high risk preg due to social problems, second tri (Primary) Up to date FH was low at last visit (2cm behind), persistent finding today- has Radiology read anatomy at [facility] but no MFM scan Vigorous movement Vertex by leopolds Reviewed process of IOL briefly and possibility of  staged IOL with outpatient FB Discussed need for [place] for growth and possibility of IOL for FGR if < 10th% Has birth plan -- wants to avoid pitocin even for PPH prevention, plans to upload to mychart   - Cervicovaginal ancillary only( [place]) - Strep Gp B NAA - [place] MFM OB FOLLOW UP; Future  - Pt interested in waterbirth and has attended the class.  - Reviewed conditions in labor that will risk her out of water immersion including thick meconium or blood stained amniotic fluid, non-reassuring fetal status on monitor, excessive bleeding, hypertension, dizziness, use of IV meds, damaged equipment or staffing that does not allow for water immersion, etc.  - The attending midwife must be on the unit for water immersion to begin; pt understands this may delay the start of water immersion. - Reminded pt that signing consent in labor at the hospital also acknowledges they will exit the tub if the attending midwife requests. - Consent given to patient for review.  Consent will be reviewed and signed at the hospital by the waterbirth provider prior to use of the tub. - Discussed other labor support options if waterbirth becomes unavailable, including position change, freedom of movement, use of birthing ball, and/or use of hydrotherapy in the shower (dependent upon medical condition/provider discretion).   2. Schizoaffective disorder, chronic condition (HCC) Stable on Abilify/Celexa Has outpatient team for mental health-  Pyschiatrist- Ovidio [facility]) Perinatal pysch NP: Bernerd Pho NP  3. [redacted] weeks gestation of pregnancy - [place] [facility] OB FOLLOW UP; Future  4. Uterine size-date discrepancy in third trimester - [place] MFM OB FOLLOW UP; Future  Preterm labor symptoms and general obstetric precautions including but not limited to vaginal bleeding, contractions, leaking of fluid and fetal movement were reviewed in detail with the patient. Please refer to After Visit Summary  for other counseling recommendations.   No follow-ups on file.  Future Appointments  Date Time Provider Department Center  05/07/2023  9:35 AM Reva Bores, MD [facility]  05/14/2023 10:35 AM Reva Bores, MD [facility]  05/21/2023 10:55 AM [place] Bing, MD [facility]  05/28/2023 10:55 AM Reva Bores, MD [facility]  07/23/2023  8:40 AM Dana Allan, MD [facility]    Federico Flake, MD

## 2023-05-02 NOTE — Progress Notes (Signed)
ROB  CC: pain and pressure. Cx check today.   Pt concerned about baby measuring small.

## 2023-05-03 ENCOUNTER — Observation Stay
Admission: EM | Admit: 2023-05-03 | Discharge: 2023-05-03 | Disposition: A | Discharge status: Home / Self Care | Payer: Managed Care, Other (non HMO) | Attending: Licensed Practical Nurse | Admitting: Licensed Practical Nurse

## 2023-05-03 ENCOUNTER — Other Ambulatory Visit: Payer: Self-pay

## 2023-05-03 ENCOUNTER — Inpatient Hospital Stay: Payer: Managed Care, Other (non HMO)

## 2023-05-03 DIAGNOSIS — Z79899 Other long term (current) drug therapy: Secondary | ICD-10-CM | POA: Diagnosis not present

## 2023-05-03 DIAGNOSIS — O365930 Maternal care for other known or suspected poor fetal growth, third trimester, not applicable or unspecified: Secondary | ICD-10-CM

## 2023-05-03 DIAGNOSIS — O0972 Supervision of high risk pregnancy due to social problems, second trimester: Principal | ICD-10-CM

## 2023-05-03 DIAGNOSIS — Z87891 Personal history of nicotine dependence: Secondary | ICD-10-CM | POA: Diagnosis not present

## 2023-05-03 DIAGNOSIS — O26841 Uterine size-date discrepancy, first trimester: Secondary | ICD-10-CM | POA: Diagnosis not present

## 2023-05-03 DIAGNOSIS — O1493 Unspecified pre-eclampsia, third trimester: Principal | ICD-10-CM | POA: Insufficient documentation

## 2023-05-03 DIAGNOSIS — O26891 Other specified pregnancy related conditions, first trimester: Secondary | ICD-10-CM | POA: Diagnosis not present

## 2023-05-03 DIAGNOSIS — Z3A01 Less than 8 weeks gestation of pregnancy: Secondary | ICD-10-CM | POA: Diagnosis not present

## 2023-05-03 DIAGNOSIS — R030 Elevated blood-pressure reading, without diagnosis of hypertension: Secondary | ICD-10-CM | POA: Diagnosis not present

## 2023-05-03 DIAGNOSIS — Z3A36 36 weeks gestation of pregnancy: Secondary | ICD-10-CM | POA: Diagnosis not present

## 2023-05-03 LAB — COMPREHENSIVE METABOLIC PANEL
ALT: 13 U/L (ref 0–44)
AST: 17 U/L (ref 15–41)
Albumin: 2.7 g/dL — ABNORMAL LOW (ref 3.5–5.0)
Alkaline Phosphatase: 121 U/L (ref 38–126)
Anion gap: 8 (ref 5–15)
BUN: 12 mg/dL (ref 6–20)
CO2: 22 mmol/L (ref 22–32)
Calcium: 8.6 mg/dL — ABNORMAL LOW (ref 8.9–10.3)
Chloride: 105 mmol/L (ref 98–111)
Creatinine, Ser: 0.42 mg/dL — ABNORMAL LOW (ref 0.44–1.00)
GFR, Estimated: >60 mL/min (ref >60)
Glucose, Bld: 93 mg/dL (ref 70–99)
Potassium: 3.5 mmol/L (ref 3.5–5.1)
Sodium: 135 mmol/L (ref 135–145)
Total Bilirubin: 0.2 mg/dL (ref 0.0–1.2)
Total Protein: 6.1 g/dL — ABNORMAL LOW (ref 6.5–8.1)

## 2023-05-03 LAB — URINALYSIS, ROUTINE W REFLEX MICROSCOPIC
Bilirubin Urine: NEGATIVE
Glucose, UA: NEGATIVE mg/dL
Hgb urine dipstick: NEGATIVE
Ketones, ur: NEGATIVE mg/dL
Nitrite: NEGATIVE
Protein, ur: 30 mg/dL — AB
Specific Gravity, Urine: 1.02 (ref 1.005–1.030)
pH: 6 (ref 5.0–8.0)

## 2023-05-03 LAB — CBC
HCT: 29.9 % — ABNORMAL LOW (ref 36.0–46.0)
Hemoglobin: 9.9 g/dL — ABNORMAL LOW (ref 12.0–15.0)
MCH: 27.6 pg (ref 26.0–34.0)
MCHC: 33.1 g/dL (ref 30.0–36.0)
MCV: 83.3 fL (ref 80.0–100.0)
Platelets: 267 10*3/uL (ref 150–400)
RBC: 3.59 MIL/uL — ABNORMAL LOW (ref 3.87–5.11)
RDW: 14.6 % (ref 11.5–15.5)
WBC: 11.1 10*3/uL — ABNORMAL HIGH (ref 4.0–10.5)
nRBC: 0 % (ref 0.0–0.2)

## 2023-05-03 LAB — PROTEIN / CREATININE RATIO, URINE
Creatinine, Urine: 152 mg/dL
Protein Creatinine Ratio: 0.13 mg/mg{creat} (ref 0.00–0.15)
Total Protein, Urine: 20 mg/dL
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
## 2023-05-03 NOTE — OB Triage Note (Signed)
 Pt being discharged home stable and ambulatory after reviewing results and POC w/ [name]. Discharge instructions reviewed, pt verbalized understanding. Pt aware to speak with her providers about further POC and knows to return to the hospital for vaginal bleeding, consistent ctx's, LOF, or decreased fetal movement. Pt verbalized understanding.

## 2023-05-03 NOTE — OB Triage Note (Signed)
 23 y.o G2P0 presents to L&D triage at [redacted]w[redacted]d w/ c/o an elevated BP at home and overall feels a little decreased fetal movement. She notes recently being told that she was measuring 2 weeks behind and is scheduled for an Korea on 3/11 but reports feeling very anxious about waiting for the imaging. She'd like an Korea today if possible. No abnormal swelling visualized, no clonus, no headache present. Monitors applied and assessing, initial BP's wnl, provider [name] notified of pt arrival.

## 2023-05-04 ENCOUNTER — Encounter: Payer: Self-pay | Admitting: Family Medicine

## 2023-05-04 LAB — URINE CULTURE: Culture: <10000 — AB

## 2023-05-04 LAB — STREP GP B NAA: Strep Gp B NAA: NEGATIVE

## 2023-05-05 DIAGNOSIS — O26849 Uterine size-date discrepancy, unspecified trimester: Secondary | ICD-10-CM | POA: Insufficient documentation

## 2023-05-05 DIAGNOSIS — O365990 Maternal care for other known or suspected poor fetal growth, unspecified trimester, not applicable or unspecified: Secondary | ICD-10-CM | POA: Insufficient documentation

## 2023-05-05 LAB — CERVICOVAGINAL ANCILLARY ONLY
Chlamydia: NEGATIVE
Comment: NEGATIVE
Comment: NORMAL
Neisseria Gonorrhea: NEGATIVE

## 2023-05-06 ENCOUNTER — Ambulatory Visit (HOSPITAL_BASED_OUTPATIENT_CLINIC_OR_DEPARTMENT_OTHER): Payer: Managed Care, Other (non HMO)

## 2023-05-06 ENCOUNTER — Other Ambulatory Visit: Payer: Self-pay

## 2023-05-06 ENCOUNTER — Other Ambulatory Visit: Payer: Self-pay | Admitting: *Deleted

## 2023-05-06 ENCOUNTER — Ambulatory Visit (HOSPITAL_BASED_OUTPATIENT_CLINIC_OR_DEPARTMENT_OTHER): Payer: Managed Care, Other (non HMO) | Admitting: Obstetrics

## 2023-05-06 ENCOUNTER — Ambulatory Visit: Payer: Managed Care, Other (non HMO) | Attending: Obstetrics and Gynecology

## 2023-05-06 VITALS — BP 115/73 | HR 96

## 2023-05-06 DIAGNOSIS — Z363 Encounter for antenatal screening for malformations: Secondary | ICD-10-CM | POA: Insufficient documentation

## 2023-05-06 DIAGNOSIS — O365930 Maternal care for other known or suspected poor fetal growth, third trimester, not applicable or unspecified: Secondary | ICD-10-CM

## 2023-05-06 DIAGNOSIS — Z3A36 36 weeks gestation of pregnancy: Secondary | ICD-10-CM | POA: Diagnosis not present

## 2023-05-06 DIAGNOSIS — O4103X0 Oligohydramnios, third trimester, not applicable or unspecified: Secondary | ICD-10-CM | POA: Diagnosis not present

## 2023-05-06 DIAGNOSIS — O99343 Other mental disorders complicating pregnancy, third trimester: Secondary | ICD-10-CM | POA: Diagnosis not present

## 2023-05-06 DIAGNOSIS — O0972 Supervision of high risk pregnancy due to social problems, second trimester: Secondary | ICD-10-CM | POA: Insufficient documentation

## 2023-05-06 DIAGNOSIS — O26849 Uterine size-date discrepancy, unspecified trimester: Secondary | ICD-10-CM

## 2023-05-06 DIAGNOSIS — O26843 Uterine size-date discrepancy, third trimester: Secondary | ICD-10-CM

## 2023-05-06 DIAGNOSIS — F259 Schizoaffective disorder, unspecified: Secondary | ICD-10-CM | POA: Diagnosis not present

## 2023-05-06 DIAGNOSIS — O365990 Maternal care for other known or suspected poor fetal growth, unspecified trimester, not applicable or unspecified: Secondary | ICD-10-CM

## 2023-05-06 NOTE — Progress Notes (Signed)
 MFM Consult Note  Tina [name] is currently at 36 weeks and 5 days.  She was seen as her fundal heights have been measuring less than her gestational age.  She denies any other problems in her current pregnancy.  She reports feeling fetal movements throughout the day.  She has screened negative for gestational diabetes.  She was seen on L&D at [facility] three days ago where she ruled out for preeclampsia.  On today's exam, the overall EFW of 5 pounds 9 ounces measures at the 11th percentile for her gestational age.    Low normal amniotic fluid with a total AFI of 6.75 cm is noted.  A 3.7 x 6 cm pocket of amniotic fluid is present.    A BPP performed today was 8 out of 8.    Doppler studies of the umbilical arteries performed today shows a normal S/D ratio of 1.92.  There were no signs of absent or reversed end-diastolic flow.  Due to the low normal EFW and amniotic fluid levels, delivery should be scheduled at around 39 weeks.    We will continue to follow her with weekly fetal testing and amniotic fluid checks until delivery.    Delivery prior to 39 weeks may be indicated should her amniotic fluid levels decrease even further.    She will return in 1 week for a BPP and fluid check.    The patient is happy and comfortable with this management plan.    She stated that all of her questions were answered today.  A total of 30 minutes was spent counseling and coordinating the care for this patient.  Greater than 50% of the time was spent in direct face-to-face contact.

## 2023-05-07 ENCOUNTER — Ambulatory Visit: Payer: Managed Care, Other (non HMO) | Admitting: Family Medicine

## 2023-05-07 ENCOUNTER — Encounter: Payer: Managed Care, Other (non HMO) | Admitting: Family Medicine

## 2023-05-07 ENCOUNTER — Encounter: Payer: Self-pay | Admitting: Family Medicine

## 2023-05-07 VITALS — BP 112/73 | HR 91 | Wt 141.4 lb

## 2023-05-07 DIAGNOSIS — O98519 Other viral diseases complicating pregnancy, unspecified trimester: Secondary | ICD-10-CM | POA: Diagnosis not present

## 2023-05-07 DIAGNOSIS — Z3A36 36 weeks gestation of pregnancy: Secondary | ICD-10-CM

## 2023-05-07 DIAGNOSIS — O365990 Maternal care for other known or suspected poor fetal growth, unspecified trimester, not applicable or unspecified: Secondary | ICD-10-CM

## 2023-05-07 DIAGNOSIS — F319 Bipolar disorder, unspecified: Secondary | ICD-10-CM | POA: Diagnosis not present

## 2023-05-07 DIAGNOSIS — O0972 Supervision of high risk pregnancy due to social problems, second trimester: Secondary | ICD-10-CM

## 2023-05-07 DIAGNOSIS — B009 Herpesviral infection, unspecified: Secondary | ICD-10-CM

## 2023-05-07 NOTE — Progress Notes (Signed)
 ROB: Denies any concerns

## 2023-05-07 NOTE — Progress Notes (Signed)
   PRENATAL VISIT NOTE  Subjective:  Tina [name] is a 23 y.o. G2P0010 at [redacted]w[redacted]d being seen today for ongoing prenatal care.  She is currently monitored for the following issues for this high-risk pregnancy and has Obsessive-compulsive disorder; Bipolar 1 disorder (HCC); Suprvsn of high risk preg due to social problems, second tri; HSV-2 infection complicating pregnancy, unspecified trimester; Asthma; Schizoaffective disorder, chronic condition (HCC); Fetal growth restriction antepartum; and Uterine size date discrepancy, antepartum on their problem list.  Patient reports no complaints.  Contractions: Irregular. Vag. Bleeding: None.  Movement: Present. Denies leaking of fluid.   The following portions of the patient's history were reviewed and updated as appropriate: allergies, current medications, past family history, past medical history, past social history, past surgical history and problem list.   Objective:   Vitals:   05/07/23 0925  BP: 112/73  Pulse: 91  Weight: 141 lb 6.4 oz (64.1 kg)    Fetal Status: Fetal Heart Rate (bpm): 158 Fundal Height: 33 cm Movement: Present  Presentation: Vertex  General:  Alert, oriented and cooperative. Patient is in no acute distress.  Skin: Skin is warm and dry. No rash noted.   Cardiovascular: Normal heart rate noted  Respiratory: Normal respiratory effort, no problems with respiration noted  Abdomen: Soft, gravid, appropriate for gestational age.  Pain/Pressure: Present     Pelvic: Cervical exam deferred        Extremities: Normal range of motion.  Edema: None  Mental Status: Normal mood and affect. Normal behavior. Normal judgment and thought content.   Assessment and Plan:  Pregnancy: G2P0010 at [redacted]w[redacted]d 1. Suprvsn of high risk preg due to social problems, second tri (Primary) Continue prenatal care.  2. Bipolar 1 disorder (HCC) On abilify and Celexa  3. Fetal growth restriction antepartum @ 11 % with low fluid and normal Dopplers IOL  Scheduled @ 39 weeks  4. HSV-2 infection complicating pregnancy, unspecified trimester On Valtrex  5. [redacted] weeks gestation of pregnancy   Preterm labor symptoms and general obstetric precautions including but not limited to vaginal bleeding, contractions, leaking of fluid and fetal movement were reviewed in detail with the patient. Please refer to After Visit Summary for other counseling recommendations.   Return in 1 week (on 05/14/2023).  Future Appointments  Date Time Provider Department Center  05/13/2023  7:30 AM [facility] US5 [facility]  05/14/2023 10:35 AM Reva Bores, MD [facility]  05/20/2023 11:15 AM Lennart Pall, MD [facility]  05/20/2023  1:30 PM [facility] US6 [facility]  05/22/2023  6:45 AM [facility] SCHED ROOM [facility] None  07/23/2023  8:40 AM Dana Allan, MD [facility]    Reva Bores, MD

## 2023-05-08 ENCOUNTER — Telehealth (HOSPITAL_COMMUNITY): Payer: Self-pay | Admitting: *Deleted

## 2023-05-08 ENCOUNTER — Encounter (HOSPITAL_COMMUNITY): Payer: Self-pay | Admitting: *Deleted

## 2023-05-08 NOTE — Telephone Encounter (Signed)
 Preadmission screen

## 2023-05-13 ENCOUNTER — Ambulatory Visit: Payer: Managed Care, Other (non HMO) | Attending: Obstetrics

## 2023-05-13 ENCOUNTER — Other Ambulatory Visit: Payer: Self-pay

## 2023-05-13 DIAGNOSIS — Z3A37 37 weeks gestation of pregnancy: Secondary | ICD-10-CM

## 2023-05-13 DIAGNOSIS — O365930 Maternal care for other known or suspected poor fetal growth, third trimester, not applicable or unspecified: Secondary | ICD-10-CM

## 2023-05-13 DIAGNOSIS — O26843 Uterine size-date discrepancy, third trimester: Secondary | ICD-10-CM

## 2023-05-13 DIAGNOSIS — O4103X0 Oligohydramnios, third trimester, not applicable or unspecified: Secondary | ICD-10-CM | POA: Diagnosis not present

## 2023-05-14 ENCOUNTER — Ambulatory Visit (INDEPENDENT_AMBULATORY_CARE_PROVIDER_SITE_OTHER): Payer: Managed Care, Other (non HMO) | Admitting: Family Medicine

## 2023-05-14 VITALS — BP 116/79 | HR 98 | Wt 147.6 lb

## 2023-05-14 DIAGNOSIS — F319 Bipolar disorder, unspecified: Secondary | ICD-10-CM

## 2023-05-14 DIAGNOSIS — O365990 Maternal care for other known or suspected poor fetal growth, unspecified trimester, not applicable or unspecified: Secondary | ICD-10-CM | POA: Diagnosis not present

## 2023-05-14 DIAGNOSIS — O0972 Supervision of high risk pregnancy due to social problems, second trimester: Secondary | ICD-10-CM

## 2023-05-14 DIAGNOSIS — O99343 Other mental disorders complicating pregnancy, third trimester: Secondary | ICD-10-CM

## 2023-05-14 DIAGNOSIS — O0973 Supervision of high risk pregnancy due to social problems, third trimester: Secondary | ICD-10-CM

## 2023-05-14 DIAGNOSIS — O98513 Other viral diseases complicating pregnancy, third trimester: Secondary | ICD-10-CM | POA: Diagnosis not present

## 2023-05-14 DIAGNOSIS — B009 Herpesviral infection, unspecified: Secondary | ICD-10-CM

## 2023-05-14 DIAGNOSIS — Z3A37 37 weeks gestation of pregnancy: Secondary | ICD-10-CM

## 2023-05-14 DIAGNOSIS — F259 Schizoaffective disorder, unspecified: Secondary | ICD-10-CM

## 2023-05-14 NOTE — Progress Notes (Signed)
   PRENATAL VISIT NOTE  Subjective:  Tina [name] is a 23 y.o. G2P0010 at [redacted]w[redacted]d being seen today for ongoing prenatal care.  She is currently monitored for the following issues for this high-risk pregnancy and has Obsessive-compulsive disorder; Bipolar 1 disorder (HCC); Suprvsn of high risk preg due to social problems, second tri; HSV-2 infection complicating pregnancy, unspecified trimester; Asthma; Schizoaffective disorder, chronic condition (HCC); Fetal growth restriction antepartum; and Uterine size date discrepancy, antepartum on their problem list.  Patient reports  pelvic pressure .  Contractions: Irregular. Vag. Bleeding: None.  Movement: Present. Denies leaking of fluid.   The following portions of the patient's history were reviewed and updated as appropriate: allergies, current medications, past family history, past medical history, past social history, past surgical history and problem list.   Objective:   Vitals:   05/14/23 1030  BP: 116/79  Pulse: 98  Weight: 147 lb 9.6 oz (67 kg)    Fetal Status: Fetal Heart Rate (bpm): 135 Fundal Height: 31 cm Movement: Present     General:  Alert, oriented and cooperative. Patient is in no acute distress.  Skin: Skin is warm and dry. No rash noted.   Cardiovascular: Normal heart rate noted  Respiratory: Normal respiratory effort, no problems with respiration noted  Abdomen: Soft, gravid, appropriate for gestational age.  Pain/Pressure: Present     Pelvic: Cervical exam deferred        Extremities: Normal range of motion.  Edema: None  Mental Status: Normal mood and affect. Normal behavior. Normal judgment and thought content.   Assessment and Plan:  Pregnancy: G2P0010 at [redacted]w[redacted]d 1. Suprvsn of high risk preg due to social problems, second tri (Primary) Continue routine prenatal care.   2. Schizoaffective disorder, chronic condition (HCC) On Abilify and Celexa, has perinatal psych on board.  3. HSV-2 infection complicating  pregnancy, unspecified trimester On Valtrex for suppression  4. Fetal growth restriction antepartum 11%--for IOL @ 39 weeks,  In weekly testing with MFM  5. Bipolar 1 disorder (HCC) See above  6. [redacted] weeks gestation of pregnancy   Term labor symptoms and general obstetric precautions including but not limited to vaginal bleeding, contractions, leaking of fluid and fetal movement were reviewed in detail with the patient. Please refer to After Visit Summary for other counseling recommendations.   Return in 1 week (on 05/21/2023).  Future Appointments  Date Time Provider Department Center  05/20/2023 11:15 AM Lennart Pall, MD [facility]  05/20/2023  1:30 PM [facility] US6 [facility]  05/22/2023  6:45 AM [facility] SCHED ROOM [facility] None  07/23/2023  8:40 AM Dana Allan, MD [facility]    Reva Bores, MD

## 2023-05-14 NOTE — Progress Notes (Signed)
 ROB: Denies any concerns

## 2023-05-20 ENCOUNTER — Ambulatory Visit: Payer: Managed Care, Other (non HMO)

## 2023-05-20 ENCOUNTER — Ambulatory Visit (INDEPENDENT_AMBULATORY_CARE_PROVIDER_SITE_OTHER): Payer: Managed Care, Other (non HMO) | Admitting: Obstetrics and Gynecology

## 2023-05-20 ENCOUNTER — Ambulatory Visit (HOSPITAL_BASED_OUTPATIENT_CLINIC_OR_DEPARTMENT_OTHER): Payer: Managed Care, Other (non HMO)

## 2023-05-20 VITALS — BP 119/80 | HR 109 | Wt 148.6 lb

## 2023-05-20 DIAGNOSIS — O365990 Maternal care for other known or suspected poor fetal growth, unspecified trimester, not applicable or unspecified: Secondary | ICD-10-CM

## 2023-05-20 DIAGNOSIS — O98519 Other viral diseases complicating pregnancy, unspecified trimester: Secondary | ICD-10-CM | POA: Diagnosis not present

## 2023-05-20 DIAGNOSIS — B009 Herpesviral infection, unspecified: Secondary | ICD-10-CM

## 2023-05-20 DIAGNOSIS — Z3A38 38 weeks gestation of pregnancy: Secondary | ICD-10-CM

## 2023-05-20 DIAGNOSIS — O26843 Uterine size-date discrepancy, third trimester: Secondary | ICD-10-CM | POA: Diagnosis not present

## 2023-05-20 DIAGNOSIS — Z3480 Encounter for supervision of other normal pregnancy, unspecified trimester: Secondary | ICD-10-CM

## 2023-05-20 DIAGNOSIS — J4540 Moderate persistent asthma, uncomplicated: Secondary | ICD-10-CM

## 2023-05-20 DIAGNOSIS — O365930 Maternal care for other known or suspected poor fetal growth, third trimester, not applicable or unspecified: Secondary | ICD-10-CM

## 2023-05-20 DIAGNOSIS — F319 Bipolar disorder, unspecified: Secondary | ICD-10-CM | POA: Diagnosis not present

## 2023-05-20 DIAGNOSIS — O4103X0 Oligohydramnios, third trimester, not applicable or unspecified: Secondary | ICD-10-CM | POA: Insufficient documentation

## 2023-05-20 DIAGNOSIS — F259 Schizoaffective disorder, unspecified: Secondary | ICD-10-CM

## 2023-05-20 NOTE — Progress Notes (Signed)
   PRENATAL VISIT NOTE  Subjective:  Tina [name] is a 23 y.o. G2P0010 at [redacted]w[redacted]d being seen today for ongoing prenatal care.  She is currently monitored for the following issues for this high-risk pregnancy and has Obsessive-compulsive disorder; Bipolar 1 disorder (HCC); Suprvsn of high risk preg due to social problems, second tri; HSV-2 infection complicating pregnancy, unspecified trimester; Asthma; Schizoaffective disorder, chronic condition (HCC); and Fetal growth restriction - RESOLVED on their problem list.  Patient reports  more pelvic pressure .  Contractions: Irregular. Vag. Bleeding: None.  Movement: Present. Denies leaking of fluid.   The following portions of the patient's history were reviewed and updated as appropriate: allergies, current medications, past family history, past medical history, past social history, past surgical history and problem list.   Objective:   Vitals:   05/20/23 1119  BP: 119/80  Pulse: (!) 109  Weight: 148 lb 9.6 oz (67.4 kg)    Fetal Status: Fetal Heart Rate (bpm): 161   Movement: Present     General:  Alert, oriented and cooperative. Patient is in no acute distress.  Skin: Skin is warm and dry. No rash noted.   Cardiovascular: Normal heart rate noted  Respiratory: Normal respiratory effort, no problems with respiration noted  Abdomen: Soft, gravid, appropriate for gestational age.  Pain/Pressure: Present      Assessment and Plan:  Pregnancy: G2P0010 at [redacted]w[redacted]d 1. Supervision of other normal pregnancy, antepartum (Primary) 2. [redacted] weeks gestation of pregnancy - IOL scheduled 3/13 for low normal EFW and subjectively decreased AFI (normal 3/4) Pt desires natural induction. Discussed if she desires natural childbirth, we may want to delay her induction to allow for more time to labor spontaneously. She declines this option.  Discussed IOL expectations, cadence, indications for miso/FB/AROM/pitocin, indications for intrapartum CS. Has doula.  Reviewed  that remote monitors are available to allow for mobility during induction process - MOC: interval IUD - not currently sexually active, FOB is not involved so she declines bridge method; discussed option for postplacental IUD which she'd prefer to avoid (planning no epidural, worried about risks of expulsion/malposition that we reviewed) - Declines tdap today, would like to get postpartum  - Confirmed plans to breast feed, has pediatrician and desires circ  3. Bipolar 1 disorder (HCC) 5. Schizoaffective disorder, chronic condition (HCC) Feels she is doing great from mental health/mood standpoint Taking abilify & celexa prn Working with perinatal psych and has an appt 3/17. Has a counselor through school Lenor Coffin information given  4. HSV-2 infection complicating pregnancy, unspecified trimester Continue valtrex  6. Moderate persistent asthma without complication No respiratory complaints  7. Fetal growth restriction antepartum @36 /5 2513g (11%), AC 17%, AFI 6.75, cephalic, anterior BPP today IOL scheduled 3/13  Future Appointments  Date Time Provider Department Center  05/20/2023  1:30 PM [facility] US6 [facility]  05/22/2023  6:45 AM [facility] SCHED ROOM [facility] None  07/23/2023  8:40 AM Dana Allan, MD [facility]    Lennart Pall, MD

## 2023-05-20 NOTE — Patient Instructions (Signed)
 Tina [name] is a virtual mental health platform available to our patients   [email or web address]

## 2023-05-20 NOTE — Progress Notes (Signed)
 ROB: denies any concerns

## 2023-05-21 ENCOUNTER — Encounter: Payer: Managed Care, Other (non HMO) | Admitting: Obstetrics and Gynecology

## 2023-05-22 ENCOUNTER — Inpatient Hospital Stay (HOSPITAL_COMMUNITY): Payer: Managed Care, Other (non HMO)

## 2023-05-22 ENCOUNTER — Encounter (HOSPITAL_COMMUNITY): Payer: Self-pay | Admitting: Family Medicine

## 2023-05-22 ENCOUNTER — Inpatient Hospital Stay (HOSPITAL_COMMUNITY)
Admission: RE | Admit: 2023-05-22 | Discharge: 2023-05-25 | DRG: 806 | Disposition: A | Discharge status: Home / Self Care | Payer: Managed Care, Other (non HMO) | Attending: Obstetrics and Gynecology | Admitting: Obstetrics and Gynecology

## 2023-05-22 ENCOUNTER — Other Ambulatory Visit: Payer: Self-pay

## 2023-05-22 DIAGNOSIS — Z8249 Family history of ischemic heart disease and other diseases of the circulatory system: Secondary | ICD-10-CM | POA: Diagnosis not present

## 2023-05-22 DIAGNOSIS — O365990 Maternal care for other known or suspected poor fetal growth, unspecified trimester, not applicable or unspecified: Secondary | ICD-10-CM | POA: Diagnosis not present

## 2023-05-22 DIAGNOSIS — Z87891 Personal history of nicotine dependence: Secondary | ICD-10-CM

## 2023-05-22 DIAGNOSIS — Z79899 Other long term (current) drug therapy: Secondary | ICD-10-CM | POA: Diagnosis not present

## 2023-05-22 DIAGNOSIS — O9832 Other infections with a predominantly sexual mode of transmission complicating childbirth: Secondary | ICD-10-CM | POA: Diagnosis present

## 2023-05-22 DIAGNOSIS — O9952 Diseases of the respiratory system complicating childbirth: Secondary | ICD-10-CM | POA: Diagnosis present

## 2023-05-22 DIAGNOSIS — Z833 Family history of diabetes mellitus: Secondary | ICD-10-CM | POA: Diagnosis not present

## 2023-05-22 DIAGNOSIS — A6000 Herpesviral infection of urogenital system, unspecified: Secondary | ICD-10-CM | POA: Diagnosis present

## 2023-05-22 DIAGNOSIS — O99344 Other mental disorders complicating childbirth: Secondary | ICD-10-CM | POA: Diagnosis present

## 2023-05-22 DIAGNOSIS — J45909 Unspecified asthma, uncomplicated: Secondary | ICD-10-CM | POA: Diagnosis present

## 2023-05-22 DIAGNOSIS — O365930 Maternal care for other known or suspected poor fetal growth, third trimester, not applicable or unspecified: Secondary | ICD-10-CM | POA: Diagnosis present

## 2023-05-22 DIAGNOSIS — F250 Schizoaffective disorder, bipolar type: Secondary | ICD-10-CM | POA: Diagnosis present

## 2023-05-22 DIAGNOSIS — Z3A39 39 weeks gestation of pregnancy: Secondary | ICD-10-CM

## 2023-05-22 DIAGNOSIS — O26893 Other specified pregnancy related conditions, third trimester: Secondary | ICD-10-CM | POA: Diagnosis present

## 2023-05-22 DIAGNOSIS — D62 Acute posthemorrhagic anemia: Secondary | ICD-10-CM | POA: Diagnosis not present

## 2023-05-22 DIAGNOSIS — F429 Obsessive-compulsive disorder, unspecified: Secondary | ICD-10-CM | POA: Diagnosis present

## 2023-05-22 DIAGNOSIS — Z91013 Allergy to seafood: Secondary | ICD-10-CM | POA: Diagnosis not present

## 2023-05-22 DIAGNOSIS — O0972 Supervision of high risk pregnancy due to social problems, second trimester: Principal | ICD-10-CM

## 2023-05-22 DIAGNOSIS — O9081 Anemia of the puerperium: Secondary | ICD-10-CM | POA: Diagnosis not present

## 2023-05-22 HISTORY — DX: Gastro-esophageal reflux disease without esophagitis: K21.9

## 2023-05-22 HISTORY — DX: Unspecified asthma, uncomplicated: J45.909

## 2023-05-22 LAB — CBC
HCT: 29 % — ABNORMAL LOW (ref 36.0–46.0)
Hemoglobin: 9.9 g/dL — ABNORMAL LOW (ref 12.0–15.0)
MCH: 28.1 pg (ref 26.0–34.0)
MCHC: 34.1 g/dL (ref 30.0–36.0)
MCV: 82.4 fL (ref 80.0–100.0)
Platelets: 272 10*3/uL (ref 150–400)
RBC: 3.52 MIL/uL — ABNORMAL LOW (ref 3.87–5.11)
RDW: 15.2 % (ref 11.5–15.5)
WBC: 9.4 10*3/uL (ref 4.0–10.5)
nRBC: 0 % (ref 0.0–0.2)

## 2023-05-22 LAB — TYPE AND SCREEN
ABO/RH(D): O POS
Antibody Screen: NEGATIVE

## 2023-05-22 LAB — RPR: RPR Ser Ql: NONREACTIVE

## 2023-05-22 MED ORDER — LACTATED RINGERS IV SOLN
500.0000 mL | INTRAVENOUS | Status: AC | PRN
  Administered 2023-05-22 (×2): 500 mL via INTRAVENOUS

## 2023-05-22 MED ORDER — FENTANYL CITRATE (PF) 100 MCG/2ML IJ SOLN
50.0000 ug | INTRAMUSCULAR | Status: DC | PRN
  Administered 2023-05-23 (×2): 100 ug via INTRAVENOUS
  Filled 2023-05-22 (×2): qty 2

## 2023-05-22 MED ORDER — ONDANSETRON HCL 4 MG/2ML IJ SOLN
4.0000 mg | Freq: Four times a day (QID) | INTRAMUSCULAR | Status: DC | PRN
  Administered 2023-05-23: 4 mg via INTRAVENOUS
  Filled 2023-05-22: qty 2

## 2023-05-22 MED ORDER — MISOPROSTOL 50MCG HALF TABLET
50.0000 ug | ORAL_TABLET | Freq: Once | ORAL | Status: DC
  Filled 2023-05-22: qty 1

## 2023-05-22 MED ORDER — OXYTOCIN-SODIUM CHLORIDE 30-0.9 UT/500ML-% IV SOLN
2.5000 [IU]/h | INTRAVENOUS | Status: DC
  Filled 2023-05-22: qty 500

## 2023-05-22 MED ORDER — OXYTOCIN BOLUS FROM INFUSION: 333.0000 mL | Freq: Once | INTRAVENOUS | Status: DC

## 2023-05-22 MED ORDER — MISOPROSTOL 50MCG HALF TABLET
50.0000 ug | ORAL_TABLET | Freq: Once | ORAL | Status: AC
  Administered 2023-05-22: 50 ug via ORAL

## 2023-05-22 MED ORDER — HYDROXYZINE HCL 25 MG PO TABS
25.0000 mg | ORAL_TABLET | Freq: Three times a day (TID) | ORAL | Status: DC | PRN
  Administered 2023-05-22 – 2023-05-25 (×6): 25 mg via ORAL
  Filled 2023-05-22 (×6): qty 1

## 2023-05-22 MED ORDER — FLEET ENEMA RE ENEM: 1.0000 | ENEMA | RECTAL | Status: DC | PRN

## 2023-05-22 MED ORDER — TERBUTALINE SULFATE 1 MG/ML IJ SOLN
0.2500 mg | Freq: Once | INTRAMUSCULAR | Status: DC | PRN
  Filled 2023-05-22: qty 1

## 2023-05-22 MED ORDER — MISOPROSTOL 25 MCG QUARTER TABLET
25.0000 ug | ORAL_TABLET | Freq: Once | ORAL | Status: DC
  Filled 2023-05-22: qty 1

## 2023-05-22 MED ORDER — LIDOCAINE HCL (PF) 1 % IJ SOLN
30.0000 mL | INTRAMUSCULAR | Status: AC | PRN
Start: 2023-05-22 — End: 2023-05-23
  Administered 2023-05-23: 30 mL via SUBCUTANEOUS
  Filled 2023-05-22: qty 30

## 2023-05-22 MED ORDER — ACETAMINOPHEN 325 MG PO TABS: 650.0000 mg | ORAL_TABLET | ORAL | Status: DC | PRN

## 2023-05-22 MED ORDER — SOD CITRATE-CITRIC ACID 500-334 MG/5ML PO SOLN
30.0000 mL | ORAL | Status: DC | PRN
Start: 2023-05-22 — End: 2023-05-23

## 2023-05-22 MED ORDER — MISOPROSTOL 25 MCG QUARTER TABLET
25.0000 ug | ORAL_TABLET | Freq: Once | ORAL | Status: AC
  Administered 2023-05-22: 25 ug via VAGINAL

## 2023-05-22 MED ORDER — MISOPROSTOL 50MCG HALF TABLET
50.0000 ug | ORAL_TABLET | Freq: Once | ORAL | Status: AC
  Administered 2023-05-22: 50 ug via ORAL
  Filled 2023-05-22: qty 1

## 2023-05-22 MED ORDER — LACTATED RINGERS IV SOLN: INTRAVENOUS | Status: AC

## 2023-05-22 NOTE — Progress Notes (Signed)
 Labor Progress Note Tina [name] is a 23 y.o. G2P0010 at [redacted]w[redacted]d presented for IOL for low normal EFW and subjectively low AFI (28)   S:  Coping well s/p vistaril and support from nursing staff.   O:  BP 111/73   Pulse 86   Temp 98.6 F (37 C) (Oral)   Resp 18   Ht 5\' 2"  (1.575 m)   Wt 68.1 kg   LMP 08/22/2022 (Exact Date)   SpO2 100%   BMI 27.45 kg/m  EFM: baseline 130 bpm/ mod variability/ 15x15 accels/ absent decels  Toco/IUPC: occasional  SVE: Dilation: Fingertip Effacement (%): 90 Cervical Position: Posterior Station: Plus 1 (cervix posterior behind head) Presentation: Vertex Exam by:: S Warren Hill CNM Pitocin: 0 mu/min  A/P: 23 y.o. G2P0010 [redacted]w[redacted]d IOL with cytotec   1. Labor: IOL in latent phase, cytotec for cervical ripening after RBA discussion.  2. FWB: Cat 1  3. Pain: Coping well. Nitrous for SCE, other methods on request. Nursing support. Doula not present but patient reports she is coming.  4. GBS negative 5. Mental health: hx bipolar and schizoaffective. Consult for [facility] order placed.    Anticipate NVSB.  [name], CNM 12:16 PM
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
## 2023-05-22 NOTE — Progress Notes (Signed)
 Patient declines FB after RBA. Amenable to SCE after breakfast. Plan nitrous for exam. RBA of cervical ripening vs. Pitocin discussed with patient.  Patient reports history of mental health struggles, c/w dx of bipolar and schizoaffective DO.   Lamont Snowball, MSN, CNM, RNC-OB Certified Nurse Midwife, [facility] 05/22/2023 10:14 AM

## 2023-05-22 NOTE — Progress Notes (Signed)
 Patient Vitals for the past 4 hrs:  BP Pulse Resp  05/22/23 2018 122/89 86 16  05/22/23 1800 -- -- 18   Feeling crampy.  FHR Cat 1.  Ctx q 1 minute.  Cx 1/90/0, mid position.  Pt declines AROM/Foley.  Ctx too close for another cytotec or pitocin.  Expectant mgt.

## 2023-05-22 NOTE — Progress Notes (Signed)
 Labor Progress Note Tina [name] is a 23 y.o. G2P0010 at [redacted]w[redacted]d presented for IOL  S:  Coping well with movement, breathwork and intermittent nitrous.   O:  BP 125/81   Pulse 85   Temp 98.6 F (37 C) (Oral)   Resp 18   Ht 5\' 2"  (1.575 m)   Wt 68.1 kg   LMP 08/22/2022 (Exact Date)   SpO2 100%   BMI 27.45 kg/m   EFM: baseline 145 bpm/ mod  variability/ 15x15 accels/ occ variable decels  Toco/IUPC: 2-4  SVE: Dilation: Fingertip Effacement (%): 90 Cervical Position: Posterior Station: Plus 1 (cervix posterior behind head) Presentation: Vertex Exam by:: S Warren Hill CNM Pitocin: 0 mu/min  A/P: 23 y.o. G2P0010 [redacted]w[redacted]d IOL   1. Labor: IOL in latent phase of labor s/p dual cytotec x1. Repeat oral dose only ( ), plan to start pitocin once 3-4 cm. Patient declines FB or AROM. Encourage forward-leaning and upright positioning.  2. FWB: Cat 1 3. Pain: Coping well with movement, breathwork and intermittent nitrous 4. GBS neg    Anticipate NVSB.  [name], CNM 4:28 PM

## 2023-05-22 NOTE — Progress Notes (Signed)
 CNM to bedside to inform patient that psych consult is placed and that she will be supported by nursing staff, midwives and [employer] while here.   Discussion of upcoming plan to include pitocin titration PRN and oncoming CNM staff.  Lamont Snowball, MSN, CNM, RNC-OB Certified Nurse Midwife, [facility] 05/22/2023 6:51 PM'

## 2023-05-23 ENCOUNTER — Encounter (HOSPITAL_COMMUNITY): Payer: Self-pay | Admitting: Family Medicine

## 2023-05-23 DIAGNOSIS — Z3A39 39 weeks gestation of pregnancy: Secondary | ICD-10-CM

## 2023-05-23 DIAGNOSIS — O99344 Other mental disorders complicating childbirth: Secondary | ICD-10-CM

## 2023-05-23 DIAGNOSIS — O9832 Other infections with a predominantly sexual mode of transmission complicating childbirth: Secondary | ICD-10-CM

## 2023-05-23 DIAGNOSIS — F250 Schizoaffective disorder, bipolar type: Secondary | ICD-10-CM | POA: Diagnosis present

## 2023-05-23 MED ORDER — COCONUT OIL OIL: 1.0000 | TOPICAL_OIL | Status: DC | PRN

## 2023-05-23 MED ORDER — OXYTOCIN 10 UNIT/ML IJ SOLN
10.0000 [IU] | Freq: Once | INTRAMUSCULAR | Status: AC
  Administered 2023-05-23: 10 [IU] via INTRAMUSCULAR

## 2023-05-23 MED ORDER — CITALOPRAM HYDROBROMIDE 20 MG PO TABS
20.0000 mg | ORAL_TABLET | Freq: Every day | ORAL | Status: DC
Start: 2023-05-23 — End: 2023-05-25
  Filled 2023-05-23 (×2): qty 1

## 2023-05-23 MED ORDER — ONDANSETRON HCL 4 MG PO TABS: 4.0000 mg | ORAL_TABLET | ORAL | Status: DC | PRN

## 2023-05-23 MED ORDER — LACTATED RINGERS IV SOLN: 500.0000 mL | Freq: Once | INTRAVENOUS | Status: DC

## 2023-05-23 MED ORDER — TERBUTALINE SULFATE 1 MG/ML IJ SOLN: 0.2500 mg | Freq: Once | INTRAMUSCULAR | Status: DC | PRN

## 2023-05-23 MED ORDER — ZOLPIDEM TARTRATE 5 MG PO TABS
5.0000 mg | ORAL_TABLET | Freq: Every evening | ORAL | Status: DC | PRN
Start: 2023-05-23 — End: 2023-05-25

## 2023-05-23 MED ORDER — ACETAMINOPHEN 325 MG PO TABS: 650.0000 mg | ORAL_TABLET | ORAL | Status: DC | PRN

## 2023-05-23 MED ORDER — EPHEDRINE 5 MG/ML INJ: 10.0000 mg | INTRAVENOUS | Status: DC | PRN

## 2023-05-23 MED ORDER — TETANUS-DIPHTH-ACELL PERTUSSIS 5-2.5-18.5 LF-MCG/0.5 IM SUSY: 0.5000 mL | PREFILLED_SYRINGE | Freq: Once | INTRAMUSCULAR | Status: DC

## 2023-05-23 MED ORDER — OXYTOCIN-SODIUM CHLORIDE 30-0.9 UT/500ML-% IV SOLN
1.0000 m[IU]/min | INTRAVENOUS | Status: DC
  Administered 2023-05-23 (×2): 2 m[IU]/min via INTRAVENOUS

## 2023-05-23 MED ORDER — PHENYLEPHRINE 80 MCG/ML (10ML) SYRINGE FOR IV PUSH (FOR BLOOD PRESSURE SUPPORT): 80.0000 ug | PREFILLED_SYRINGE | INTRAVENOUS | Status: DC | PRN

## 2023-05-23 MED ORDER — OXYCODONE HCL 5 MG PO TABS: 5.0000 mg | ORAL_TABLET | ORAL | Status: DC | PRN

## 2023-05-23 MED ORDER — SENNOSIDES-DOCUSATE SODIUM 8.6-50 MG PO TABS
2.0000 | ORAL_TABLET | Freq: Every day | ORAL | Status: DC
  Administered 2023-05-24: 2 via ORAL
  Filled 2023-05-23 (×2): qty 2

## 2023-05-23 MED ORDER — PRENATAL MULTIVITAMIN CH
1.0000 | ORAL_TABLET | Freq: Every day | ORAL | Status: DC
  Administered 2023-05-24: 1 via ORAL
  Filled 2023-05-23 (×2): qty 1

## 2023-05-23 MED ORDER — IBUPROFEN 600 MG PO TABS
600.0000 mg | ORAL_TABLET | Freq: Four times a day (QID) | ORAL | Status: DC
  Administered 2023-05-24 – 2023-05-25 (×4): 600 mg via ORAL
  Filled 2023-05-23 (×7): qty 1

## 2023-05-23 MED ORDER — DIBUCAINE (PERIANAL) 1 % EX OINT: 1.0000 | TOPICAL_OINTMENT | CUTANEOUS | Status: DC | PRN

## 2023-05-23 MED ORDER — OXYCODONE HCL 5 MG PO TABS: 10.0000 mg | ORAL_TABLET | ORAL | Status: DC | PRN

## 2023-05-23 MED ORDER — ARIPIPRAZOLE 10 MG PO TABS
20.0000 mg | ORAL_TABLET | Freq: Every day | ORAL | Status: DC
  Administered 2023-05-25: 20 mg via ORAL
  Filled 2023-05-23 (×3): qty 2

## 2023-05-23 MED ORDER — OXYTOCIN 10 UNIT/ML IJ SOLN
INTRAMUSCULAR | Status: AC
  Filled 2023-05-23: qty 1

## 2023-05-23 MED ORDER — BENZOCAINE-MENTHOL 20-0.5 % EX AERO
1.0000 | INHALATION_SPRAY | CUTANEOUS | Status: DC | PRN
  Administered 2023-05-23: 1 via TOPICAL
  Filled 2023-05-23: qty 56

## 2023-05-23 MED ORDER — FENTANYL-BUPIVACAINE-NACL 0.5-0.125-0.9 MG/250ML-% EP SOLN: 12.0000 mL/h | EPIDURAL | Status: DC | PRN

## 2023-05-23 MED ORDER — WITCH HAZEL-GLYCERIN EX PADS: 1.0000 | MEDICATED_PAD | CUTANEOUS | Status: DC | PRN

## 2023-05-23 MED ORDER — DIPHENHYDRAMINE HCL 25 MG PO CAPS: 25.0000 mg | ORAL_CAPSULE | Freq: Four times a day (QID) | ORAL | Status: DC | PRN

## 2023-05-23 MED ORDER — DIPHENHYDRAMINE HCL 50 MG/ML IJ SOLN: 12.5000 mg | INTRAMUSCULAR | Status: DC | PRN

## 2023-05-23 MED ORDER — ONDANSETRON HCL 4 MG/2ML IJ SOLN: 4.0000 mg | INTRAMUSCULAR | Status: DC | PRN

## 2023-05-23 MED ORDER — SIMETHICONE 80 MG PO CHEW: 80.0000 mg | CHEWABLE_TABLET | ORAL | Status: DC | PRN

## 2023-05-23 NOTE — Progress Notes (Addendum)
 Patient refusing IV fluids at this time. Patient ambulating in room and using birthing ball. Cardio adjusted and advised patient that baby needs to be monitored with disruption. Patient verbalizes at this time that she will "move to the bed when my medicine (atarax) kicks in"

## 2023-05-23 NOTE — Progress Notes (Signed)
 Sleeping.  Ctx spaced out some, still mild. FHR Cat 1.  Cx 2/90/-1/mid position.  Pt expressed desire to sleep rather than proceed w/IOL.  Offered to move to [facility], as L &D is very busy and there are many pts waiting for a labor bed in the MAU.  After considering this, pt elects to proceed w/IOL and consents to Pitocin only.  Prefers to stop pitocin once active labor is achieved and see if she can continue to "labor naturally".  Will try this once in active labor.

## 2023-05-23 NOTE — Discharge Summary (Signed)
 Postpartum Discharge Summary     Patient Name: Tina [name] DOB: [birth year] MRN: [id number]  Date of admission: 05/22/2023 Delivery date:05/23/2023 Delivering provider: Carlynn Herald Date of discharge: 05/25/2023  Admitting diagnosis: Fetal growth restriction antepartum [O36.5990] Intrauterine pregnancy: [redacted]w[redacted]d     Secondary diagnosis:  Principal Problem:   Fetal growth restriction - RESOLVED Active Problems:   Schizoaffective disorder, bipolar type [facility])  Additional problems:  Patient Active Problem List   Diagnosis Date Noted   Schizoaffective disorder, bipolar type (HCC) 05/23/2023   Fetal growth restriction - RESOLVED 05/05/2023   Asthma 02/26/2023   HSV-2 infection complicating pregnancy, unspecified trimester 11/17/2022   Suprvsn of high risk preg due to social problems, second tri 10/07/2022   Obsessive-compulsive disorder 02/26/2018       Discharge diagnosis: Term Pregnancy Delivered and Schizophrenia and Asthma and HSV2 and OCD                                               Post partum procedures: n/a  Augmentation: Cytotec Complications: None  Hospital course: Induction of Labor With Vaginal Delivery   23 y.o. yo G2P1011 at [redacted]w[redacted]d was admitted to the hospital 05/22/2023 for induction of labor.  Indication for induction:  NRNST .  Patient had an labor course complicated by N/A  Membrane Rupture Time/Date: 9:30 AM,05/23/2023  Delivery Method:Vaginal, Spontaneous Operative Delivery:N/A Episiotomy: None Lacerations:  1st degree;LabialBilateral periurethrals  Details of delivery can be found in separate delivery note.  Patient had a postpartum course complicated by acute blood loss anemia that was clinically significant requiring starting of oral iron supplementation. Patient is discharged home 05/25/23.  Newborn Data: Birth date:05/23/2023 Birth time:12:58 PM Gender:Female Living status:Living Apgars:8 ,9  Weight:2760 g  Magnesium Sulfate received: No BMZ  received: No Rhophylac:N/A MMR:N/A T-DaP: declined but desires in PP  Flu: No declined  RSV Vaccine received: No Transfusion:No  Immunizations received: There is no immunization history for the selected administration types on file for this patient.  Physical exam  Vitals:   05/24/23 0423 05/24/23 1415 05/24/23 2119 05/25/23 0615  BP: 115/84 115/77 120/87 109/83  Pulse: 86 91    Resp: 18 18 18 17   Temp: 98.5 F (36.9 C) 98.1 F (36.7 C) 98.6 F (37 C) 97.8 F (36.6 C)  TempSrc: Oral Oral Oral Oral  SpO2: 100%  100% 100%  Weight:      Height:       General: alert, cooperative, and no distress Lochia: appropriate Uterine Fundus: firm Incision: N/A DVT Evaluation: No evidence of DVT seen on physical exam. Labs: Lab Results  Component Value Date   WBC 13.5 (H) 05/24/2023   HGB 8.8 (L) 05/24/2023   HCT 26.5 (L) 05/24/2023   MCV 82.3 05/24/2023   PLT 258 05/24/2023      Latest Ref Rng & Units 05/03/2023    9:51 AM  CMP  Glucose 70 - 99 mg/dL 93   BUN 6 - 20 mg/dL 12   Creatinine 7.82 - 1.00 mg/dL 9.56   Sodium 213 - 086 mmol/L 135   Potassium 3.5 - 5.1 mmol/L 3.5   Chloride 98 - 111 mmol/L 105   CO2 22 - 32 mmol/L 22   Calcium 8.9 - 10.3 mg/dL 8.6   Total Protein 6.5 - 8.1 g/dL 6.1   Total Bilirubin 0.0 - 1.2 mg/dL 0.2  Alkaline Phos 38 - 126 U/L 121   AST 15 - 41 U/L 17   ALT 0 - 44 U/L 13    Edinburgh Score:    05/24/2023    1:11 PM  Edinburgh Postnatal Depression Scale Screening Tool  I have been able to laugh and see the funny side of things. 0  I have looked forward with enjoyment to things. 0  I have blamed myself unnecessarily when things went wrong. 1  I have been anxious or worried for no good reason. 2  I have felt scared or panicky for no good reason. 1  Things have been getting on top of me. 1  I have been so unhappy that I have had difficulty sleeping. 1  I have felt sad or miserable. 1  I have been so unhappy that I have been crying. 1   The thought of harming myself has occurred to me. 0  Edinburgh Postnatal Depression Scale Total 8   Edinburgh Postnatal Depression Scale Total: 8   After visit meds:  Allergies as of 05/25/2023       Reactions   Peanut-containing Drug Products Anaphylaxis   Shellfish Allergy Anaphylaxis   Tree Extract Anaphylaxis   Bee Pollen Itching   Other Itching   bananas        Medication List     TAKE these medications    acetaminophen 325 MG tablet Commonly known as: Tylenol Take 2 tablets (650 mg total) by mouth every 4 (four) hours as needed (for pain scale < 4).   albuterol 108 (90 Base) MCG/ACT inhaler Commonly known as: VENTOLIN HFA Inhale 1-2 puffs into the lungs every 6 (six) hours as needed.   ARIPiprazole 20 MG tablet Commonly known as: ABILIFY Take 20 mg by mouth at bedtime as needed.   cetirizine 10 MG tablet Commonly known as: ZyrTEC Allergy Take 1 tablet (10 mg total) by mouth daily.   citalopram 20 MG tablet Commonly known as: CELEXA Take 20 mg by mouth daily.   ferrous sulfate 325 (65 FE) MG EC tablet Take 325 mg by mouth daily with breakfast.   hydrOXYzine 25 MG tablet Commonly known as: ATARAX Take 25 mg by mouth 3 (three) times daily as needed.   ibuprofen 600 MG tablet Commonly known as: ADVIL Take 1 tablet (600 mg total) by mouth every 6 (six) hours.   multivitamin-prenatal 27-0.8 MG Tabs tablet Take 1 tablet by mouth daily at 12 noon.   ondansetron 4 MG disintegrating tablet Commonly known as: ZOFRAN-ODT Take 1 tablet (4 mg total) by mouth every 8 (eight) hours as needed for nausea or vomiting.   senna-docusate 8.6-50 MG tablet Commonly known as: Senokot-S Take 2 tablets by mouth 2 (two) times daily as needed for mild constipation.   Trelegy Ellipta 200-62.5-25 MCG/ACT Aepb Generic drug: Fluticasone-Umeclidin-Vilant Inhale 1 puff into the lungs daily.   valACYclovir 500 MG tablet Commonly known as: VALTREX Take two tablets by mouth  twice daily for ten days; then one tablet twice daily for remainder of pregnancy   Vitamin D3 50 MCG (2000 UT) capsule Take 2,000 Units by mouth daily.         Discharge home in stable condition Infant Feeding: Bottle and Breast Infant Disposition:home with mother Discharge instruction: per After Visit Summary and Postpartum booklet. Activity: Advance as tolerated. Pelvic rest for 6 weeks.  Diet: routine diet Future Appointments: Future Appointments  Date Time Provider Department Center  07/23/2023  8:40 AM Dana Allan, MD [id number]   Follow up Visit:   Please schedule this patient for a In person postpartum visit in 4 weeks with the following provider: MD. Additional Postpartum F/U:Postpartum Depression checkup in 1 week High risk pregnancy complicated by:  FGR  Delivery mode:  Vaginal, Spontaneous Anticipated Birth Control:  IUD  Message sent on 05/23/23  05/25/2023 Hessie Dibble, MD
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
## 2023-05-23 NOTE — Progress Notes (Signed)
 Tina [name] is a 23 y.o. G2P0010 at [redacted]w[redacted]d by LMP admitted for induction of labor due to Resolved FGR (11%) with low normal fluid resulting in a NRNST.   Subjective: Patient initially desiring to leave or be transferred to Antenatal to delay IOL until baby was ready. However upon entering patient room, she SROM'd clear fluid. Desires no intervention at this time.   Objective: BP 122/89   Pulse 65   Temp 98.1 F (36.7 C) (Oral)   Resp 16   Ht 5\' 2"  (1.575 m)   Wt 68.1 kg   LMP 08/22/2022 (Exact Date)   SpO2 100%   BMI 27.45 kg/m  No intake/output data recorded. No intake/output data recorded.  FHT:  FHR: 135 bpm, variability: moderate,  accelerations:  Present,  decelerations:  Present occasional variables with contractions with quick return to baseline.  UC:   irregular, every 3-7 minutes SVE:   Dilation: 2.5 Effacement (%): 90 Station: -1 Exam by:: H.Price, RN  Labs: Lab Results  Component Value Date   WBC 9.4 05/22/2023   HGB 9.9 (L) 05/22/2023   HCT 29.0 (L) 05/22/2023   MCV 82.4 05/22/2023   PLT 272 05/22/2023    Assessment / Plan: Induction of labor due to non-reassuring fetal testing and low normal fluid with Resolved IUGR,  progressed to SROM s/p dual cytotec, and 3 attempts at pitocin.    Labor:  If labor status is unchanged, plan to reassess for pit if patient agrees.  Fetal Wellbeing:  Category I and Category II Pain Control:  Labor support without medications and Nitrous Oxide I/D:   GBS neg  Anticipated MOD:  NSVD  Claudette Head, CNM 05/23/2023, 11:44 AM

## 2023-05-23 NOTE — Progress Notes (Signed)
 This RN entered the patients room when the FHR decelerated. Four other nurses followed to assist. When FHR returned to baseline and the other RN's left the room the patient stated " I am not okay mentally, I warned them about my psychosis earlier. At this point I am done, I will go home, fuck this kid I can have another, and if the dad didn't want to be a dad why do I have to be a mom." Patient desired she wanted to be left alone for a minute. Consulting civil engineer and CNM made aware.

## 2023-05-23 NOTE — Progress Notes (Signed)
 Patient request RN to room to disconnect cords. Upon entering patient states she wants IV disconnected. Patient states she wants to leave or go to antepartum because "the baby is having a hard time with the pitocin so I think yall are forcing him out and he isnt ready, I was supposed to have a water birth with my doula and yall are forcing me to take this pitocin and the baby doesn't like it so I want it all turned off and a doctor in here to talk to". Rhunette Croft, CNM called and given information and will go speak to patient after finishing care with another patient.

## 2023-05-23 NOTE — Lactation Note (Signed)
 This note was copied from a baby's chart. Lactation Consultation Note  Patient Name: Tina [name] [id number] Date: 05/23/2023 Age:24 hours Reason for consult: Initial assessment;Primapara;1st time breastfeeding;Term;RN request (hx of HSV- valtrex)  P1- MOB reports that infant has been nursing great with the help of her doula. MOB states that infant had just finished nursing around 1700, but she could not remember for how long. MOB has not been keeping track of lengths of feedings, just which breast she fed from. LC encouraged MOB to start keeping track of the lengths of feedings so we can understand how well infant is doing. MOB wanted to demonstrate how well infant nurses, so she placed him on the right breast in the football hold. LC informed MOB that infant may not nurse because he just had a feeding. MOB attempted to stimulate infant, but had no success. MOB then hand expressed very large drops of yellow colostrum into infant's mouth. Infant tolerated the drops of colostrum, but was still uninterested in feeding at this time.  LC reviewed the first 24 hr birthday nap, day 2 cluster feeding, feeding infant on cue 8-12x in 24 hrs, not allowing infant to go over 3 hrs without a feeding, CDC milk storage guidelines, LC services handout and engorgement/breast care. LC encouraged MOB to call for further assistance as needed.  Maternal Data Has patient been taught Hand Expression?: Yes Does the patient have breastfeeding experience prior to this delivery?: No  Feeding Mother's Current Feeding Choice: Breast Milk  LATCH Score Latch: Too sleepy or reluctant, no latch achieved, no sucking elicited.  Audible Swallowing: None  Type of Nipple: Everted at rest and after stimulation  Comfort (Breast/Nipple): Soft / non-tender  Hold (Positioning): No assistance needed to correctly position infant at breast.  LATCH Score: 6   Lactation Tools Discussed/Used Pump Education: Milk  Storage  Interventions Interventions: Breast feeding basics reviewed;Assisted with latch;Hand express;Breast compression;Adjust position;Support pillows;Position options;Expressed milk;Education;LC Services brochure  Discharge Discharge Education: Engorgement and breast care;Warning signs for feeding baby Pump: DEBP;Manual;Hands Free;Personal  Consult Status Consult Status: Follow-up Date: 05/24/23 Follow-up type: In-patient    Dema Severin BS, IBCLC 05/23/2023, 6:14 PM

## 2023-05-23 NOTE — Progress Notes (Signed)
 4 minutes after pitocin started, FHR decelerated to 60s w/return to baseline after normal resuscitative measures, and pitocin turned off.  Pt very upset with the whole situation and refusing care.  See note from RN. Will allow pt her time to get in a better mental space.

## 2023-05-24 DIAGNOSIS — F250 Schizoaffective disorder, bipolar type: Secondary | ICD-10-CM

## 2023-05-24 LAB — CBC
HCT: 26.5 % — ABNORMAL LOW (ref 36.0–46.0)
Hemoglobin: 8.8 g/dL — ABNORMAL LOW (ref 12.0–15.0)
MCH: 27.3 pg (ref 26.0–34.0)
MCHC: 33.2 g/dL (ref 30.0–36.0)
MCV: 82.3 fL (ref 80.0–100.0)
Platelets: 258 10*3/uL (ref 150–400)
RBC: 3.22 MIL/uL — ABNORMAL LOW (ref 3.87–5.11)
RDW: 15.5 % (ref 11.5–15.5)
WBC: 13.5 10*3/uL — ABNORMAL HIGH (ref 4.0–10.5)
nRBC: 0 % (ref 0.0–0.2)

## 2023-05-24 MED ORDER — FERROUS SULFATE 300 (60 FE) MG/5ML PO SOLN
300.0000 mg | Freq: Every day | ORAL | Status: DC
Start: 2023-05-25 — End: 2023-05-25
  Administered 2023-05-25: 300 mg via ORAL
  Filled 2023-05-24: qty 5

## 2023-05-24 NOTE — Lactation Note (Signed)
 This note was copied from a baby's chart. Lactation Consultation Note  Patient Name: Tina [name] [id number] Date: 05/24/2023 Age:24 hours Reason for consult: Mother's request;Difficult latch;1st time breastfeeding MOB requested latch assistance due infant not latching well at the breast tonight, MOB briefly latched infant on her right breast using the cross cradle hold, MOB switched positions due to infant not sustaining  his latch and MOB not latched infant on her left breast yet. MOB latched infant on the left breast using the football hold position, infant sustained latch, with depth and pillow support, infant was still BF after 12 minutes when LC left the room. MOB will continue to ask for latch assistance if needed. MOB will continue to BF infant by cues, on demand every 2-3 hours, skin to skin. MOB knows that if infant is still cuing after latching on the first breast to offer the 2nd breast during the same feeding.   Maternal Data    Feeding Mother's Current Feeding Choice: Breast Milk  LATCH Score Latch: Grasps breast easily, tongue down, lips flanged, rhythmical sucking.  Audible Swallowing: A few with stimulation  Type of Nipple: Everted at rest and after stimulation  Comfort (Breast/Nipple): Soft / non-tender  Hold (Positioning): Assistance needed to correctly position infant at breast and maintain latch.  LATCH Score: 8   Lactation Tools Discussed/Used    Interventions Interventions: Assisted with latch;Skin to skin;Adjust position;Support pillows;Breast compression;Position options;Expressed milk;Education  Discharge    Consult Status Consult Status: Follow-up Date: 05/24/23 Follow-up type: In-patient    Frederico Hamman 05/24/2023, 2:38 AM

## 2023-05-24 NOTE — Clinical Social Work Maternal (Addendum)
 CLINICAL SOCIAL WORK MATERNAL/CHILD NOTE  Patient Details  Name: Tina [name] MRN: [id number] Date of Birth: 01/18/2001  Date:  21-Mar-2023  Clinical Social Worker Initiating Note:  Sinda Du Date/Time: Initiated:  05/24/23/1533     Child's Name:  Tina [name]   Biological Parents:  Mother   Need for Interpreter:  None   Reason for Referral:  Behavioral Health Concerns, Current Substance Use/Substance Use During Pregnancy     Address:  [address] [place], [place] [address] (MOB's parents home. MOB states she will stay with her parents 2 weeks - 1 month postpartum)  [address] [place], [place] [address] (MOB's paternal grandparent's home. MOB plans to return to live with paternal grandparents after staying with her parents 2 weeks - 1 month postpartum)   Phone number:  [phone] (home)     Additional phone number:   Household Members/Support Persons (HM/SP):   Household Member/Support Person 1, Household Member/Support Person 2   HM/SP Name Relationship DOB or Age  HM/SP -1 Lendell Gallick MOB's father    HM/SP -2 Trentan Trippe MOB's mother    HM/SP -3        HM/SP -4        HM/SP -5        HM/SP -6        HM/SP -7        HM/SP -8          Natural Supports (not living in the home):  Immediate Family, Extended Family   Professional Supports: Other (Comment) (Psychiatrist (Bernerd Pho, NP [facility] [place]))   Employment: Unemployed   Type of Work:     Education:  Some Automotive engineer ((Administrator, sports, working towards [employer]))   Homebound arranged:    Surveyor, quantity Resources:  [employer], Media planner    Other Resources:  [facility]   Cultural/Religious Considerations Which May Impact Care:  Per chart review, MOB identifies as Non-Denominational  Strengths:  Ability to meet basic needs  , Home prepared for child  , Psychotropic Medications   Psychotropic Medications:   (PRN Hydroxyzine)      Pediatrician:        Pediatrician List:   [employer]    [facility]  [facility]      Pediatrician Fax Number:    Risk Factors/Current Problems:  Substance Use  , Mental Health Concerns     Cognitive State:  Linear Thinking  , Able to Concentrate  , Alert  , Goal Oriented  , Insightful     Mood/Affect:  Comfortable  , Interested  , Euthymic     CSW Assessment: CSW was consulted due to diagnoses of Schizoaffective disorder, OCD, Bipolar I Disorder, anxiety, depression, and marijuana use during pregnancy. CSW met with MOB at bedside to complete assessment. When CSW entered room, MOB was observed sitting in hospital bed. Infant was asleep on back in bassinet. During assessment, infant awoke. MOB was observed as attentive and responded appropriately to infant's cues. CSW introduced self and explained reason for consult. MOB presented as oriented x4 with a euthymic mood and anxious affect, was agreeable to consult and remained engaged throughout encounter. During assessment, was forthcoming with information. MOB did not display any acute mental health signs/symptoms and did not appear to be responding to internal stimuli.  CSW inquired how MOB is feeling emotionally since infant's arrival. MOB  reports she feels "great." CSW inquired about MOB's mental health history. MOB reported she was diagnosed with Bipolar I Disorder at age 31 which was changed to a diagnosis of Schizoaffective disorder at age 4. MOB reports she was diagnosed with depression and anxiety during middle school. Per chart review, MOB has also been diagnosed with Obsessive Compulsive Disorder. CSW inquired about auditory/visual hallucinations (AVH). MOB reports that she has endorsed AVH since early childhood. MOB reports she endorsed command hallucinations on one occasion in October 2022, marked by endorsing thoughts of hurting a future child. MOB reports that she  sought psychiatric care immediately when she endorsed aforementioned command hallucinations. MOB reports she has been hospitalized psychiatrically for inpatient care on two occasions, which is consistent with chart review (2021 [facility] [place] and 2022- [place], [place]). MOB reports that she self admitted both times she was hospitalized due to endorsing suicidal ideation without a plan. MOB denied a history of past suicide attempts. MOB was assessed by psychiatry due to endorsing anxiety symptoms during labor, see note for details. MOB was determined to be at low risk for self harm during initial psychiatric evaluation 2023/08/01 and follow up evaluation 2023-06-12.   CSW inquired about current treatment. MOB reports that she was prescribed Celexa, Abilify, and Trazodone prior to pregnancy but stopped taking psychotropic medications during her first trimester except for PRN hydroxyzine, which she reports as helpful. MOB expressed concern about infant being exposed to psychiatric medications while in utero and states she decided to discontinue psychiatric medications to limit infant's exposure. MOB reports a stable mood throughout pregnancy. CSW inquired about AVH during pregnancy. MOB reports that she endorsed [name] instances of auditory hallucinations, such as a dinging sound but reports that the sounds have not caused her distress. MOB notes some situational anxiety symptoms during pregnancy due to FOB not wanting to be involved and "resource insecurity", but reports she feels she managed stressors "okay" and felt emotionally stable. MOB reports she has been followed by Psychiatry since age 53. MOB reports she plans to "most likely" restart psychiatric medications postpartum and is followed by perinatal psychiatry, Bernerd Pho, NP [facility] [place]. MOB reports she has a telehealth psychiatry appointment scheduled April 20, 2023, 09/22/23. MOB is  not current therapist and was agreeable to outpatient mental health and crisis resources, which CSW provided. MOB endorsed good insight into her mental health symptoms, stating that she is aware when she begins to endorse AVH. MOB shared that psychiatric medications do help "dull" AVH, and noted that she is aware of her triggers, such as feeling overwhelmed and is aware of warning signs. CSW provided psycho-education about postpartum psychosis and highlighted the importance of sleep and self care postpartum. MOB identified her parents, who she plans to stay with for 2 weeks to a month postpartum as supports, as well as her paternal grandparents, which whom she lives with full time and will return to live with once she recovers postpartum. MOB also identified a close friend as a support. MOB denied endorsing current AVH and reports that the last time she endorsed AVH that was distressing was when she was hospitalized inpatient in 2022. MOB denied current SI/HI.  CSW provided education regarding the baby blues period vs. perinatal mood disorders, discussed treatment and gave resources for mental health follow up if concerns arise.  CSW recommends self-evaluation during the postpartum time period using the New Mom Checklist from Postpartum Progress and encouraged MOB to contact  a medical professional if symptoms are noted at any time.    MOB reports she has all needed items for infant, including a car seat and bassinet. MOB has chosen [facility] for infant's follow up care. CSW inquired about transportation barriers, MOB reports she can use a family member's care or Medicaid transportation for infant's pediatrician appointments.  CSW informed MOB about hospital drug screen policy due to reported use of marijuana during pregnancy. CSW explained that infant's UDS resulted as positive for THC and CDS would continue to be monitored. CSW explained a CPS report would be made due to infant's UDS resulting  positive for THC. MOB expressed understanding. CSW inquired about substance use during pregnancy. MOB acknowledged that she smoked marijuana "occasionally" during pregnancy "to manage stress levels, for mental health, and to have an appetite." MOB declined to provide further details about marijuana use. MOB denied using other illicit substances during pregnancy.   CSW provided review of Sudden Infant Death Syndrome (SIDS) precautions.    CSW placed call to [facility] Department of Social Services CPS line and made CPS report to Lucretia Roers due to infant's UDS resulting positive for THC.  CSW identifies no further need for intervention and no barriers to discharge at this time.  CSW Plan/Description:  Psychosocial Support and Ongoing Assessment of Needs, Sudden Infant Death Syndrome (SIDS) Education, Perinatal Mood and Anxiety Disorder (PMADs) Education, Hospital Drug Screen Policy Information, Other Information/Referral to [facility], Child Protective Service Report  , CSW Will Continue to Monitor Umbilical Cord Tissue Drug Screen Results and Make Report if Reggie Pile, LCSWA 2024-01-31, 3:44 PM
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
## 2023-05-24 NOTE — Discharge Instructions (Signed)
 [facility]  Mental health [place]  Address: [address], [place], [place] [address] Hours:  Closes soon ? 12?PM ? Opens 8?AM Mon Phone: [phone]  Danae Orleans, therapist Counselor in [place], [place]  Located in: [facility] Address: [address], [place], [place] [address] Phone: [phone]

## 2023-05-24 NOTE — Progress Notes (Addendum)
 OB/GYN Faculty Attending Note  Post Partum Day 1  Subjective: Patient is feeling well. States she is feeling pretty stable and much better from a hormonal perspective than she was expecting. She reports moderately well controlled pain on PO pain meds. She is ambulating and denies light-headedness or dizziness. She is  passing flatus. She is tolerating a regular diet without nausea/vomiting. Bleeding is moderate. She is bottle feeding. Baby is in room and doing well.  Objective: Blood pressure 115/77, pulse 91, temperature 98.1 F (36.7 C), temperature source Oral, resp. rate 18, height 5\' 2"  (1.575 m), weight 68.1 kg, last menstrual period 08/22/2022, SpO2 100%, unknown if currently breastfeeding. Temp:  [98.1 F (36.7 C)-99.5 F (37.5 C)] 98.1 F (36.7 C) (03/15 1415) Pulse Rate:  [86-97] 91 (03/15 1415) Resp:  [18] 18 (03/15 1415) BP: (111-129)/(77-84) 115/77 (03/15 1415) SpO2:  [98 %-100 %] 100 % (03/15 0423)  Physical Exam:  General: alert, oriented, cooperative Chest: normal respiratory effort Heart: regular rate  Abdomen: soft, appropriately tender to palpation  Uterine Fundus: firm, 2 fingers below the umbilicus Lochia: minimal, rubra DVT Evaluation: no evidence of DVT Extremities: no edema, no calf tenderness  UOP: voiding spontaneously  Recent Labs    05/22/23 0719 05/24/23 0518  HGB 9.9* 8.8*  HCT 29.0* 26.5*    Assessment/Plan: Patient Active Problem List   Diagnosis Date Noted   Schizoaffective disorder, bipolar type (HCC) 05/23/2023   Fetal growth restriction - RESOLVED 05/05/2023   Asthma 02/26/2023   HSV-2 infection complicating pregnancy, unspecified trimester 11/17/2022   Suprvsn of high risk preg due to social problems, second tri 10/07/2022   Obsessive-compulsive disorder 02/26/2018    Patient is 23 y.o. [id number] PPD#1 s/p SVD at [redacted]w[redacted]d. Course complicated by extensive psych history, she reports she is feeling much better today, very stable and has  been seen by psych, psych feels she is at low risk of harm to herself and has signed off. H/H8.8, patient declines IV iron, will restart PO.  Continue routine post partum care Pain meds prn Regular diet Interval IUD for birth control Cont abilify, celexa, atarax Plan for discharge tomorrow    K. Therese Sarah, MD, [facility] Attending Center for [employer] (Faculty Practice)  05/24/2023, 5:07 PM

## 2023-05-24 NOTE — Lactation Note (Signed)
 This note was copied from a baby's chart. Lactation Consultation Note  Patient Name: Tina [name] [id number] Date: 05/24/2023 Age:24 hours Reason for consult: Follow-up assessment;Term;1st time breastfeeding;Primapara  P1, 39 wks, @ 23 hrs of life. Mom requests latch assist. Football and cross cradle positioning on both breasts. Assisted with steps of latching and positioning- Infant worked 15 minutes on each breast- 30 minutes overall. Discussed expectations @ breast- Day 2 more awake/ feeding cues/longer feeds, and cluster feeding overnights brings milk in. Highlighted breast stimulation is tied directly to milk production. Discussed hands on breast and baby, keeping baby awake @ breast. Starting with hand expression & breast compression to get baby working @ breast, and gentle stimulation to keep baby working @ breast. Encouraged EBM for nipple care post feed. LC services and milk storage shared. Encouraged mom to call for assist anytime desired.  Maternal Data Has patient been taught Hand Expression?: Yes Does the patient have breastfeeding experience prior to this delivery?: No  Feeding Mother's Current Feeding Choice: Breast Milk  LATCH Score Latch: Grasps breast easily, tongue down, lips flanged, rhythmical sucking.  Audible Swallowing: Spontaneous and intermittent  Type of Nipple: Everted at rest and after stimulation  Comfort (Breast/Nipple): Soft / non-tender  Hold (Positioning): Assistance needed to correctly position infant at breast and maintain latch.  LATCH Score: 9   Lactation Tools Discussed/Used    Interventions Interventions: Breast feeding basics reviewed;Assisted with latch;Hand express;Breast compression;Expressed milk;Education  Discharge Pump: Personal;Manual;DEBP (Per mom has pump- both electric and manual)  Consult Status Consult Status: Follow-up Date: 05/25/23 Follow-up type: In-patient    [facility] 05/24/2023, 12:39 PM

## 2023-05-25 MED ORDER — WHITE PETROLATUM EX OINT: 1.0000 | TOPICAL_OINTMENT | CUTANEOUS | Status: DC | PRN

## 2023-05-25 MED ORDER — SENNOSIDES-DOCUSATE SODIUM 8.6-50 MG PO TABS: 2.0000 | ORAL_TABLET | Freq: Two times a day (BID) | ORAL | 0 refills | Status: AC | PRN

## 2023-05-25 MED ORDER — LIDOCAINE 1% INJECTION FOR CIRCUMCISION: 0.8000 mL | INJECTION | Freq: Once | INTRAVENOUS | Status: DC

## 2023-05-25 MED ORDER — GELATIN ABSORBABLE 12-7 MM EX MISC: 1.0000 | Freq: Once | CUTANEOUS | Status: DC | PRN

## 2023-05-25 MED ORDER — SUCROSE 24% NICU/PEDS ORAL SOLUTION: 0.5000 mL | OROMUCOSAL | Status: DC | PRN

## 2023-05-25 MED ORDER — ACETAMINOPHEN 325 MG PO TABS: 650.0000 mg | ORAL_TABLET | ORAL | 0 refills | Status: AC | PRN

## 2023-05-25 MED ORDER — IBUPROFEN 600 MG PO TABS: 600.0000 mg | ORAL_TABLET | Freq: Four times a day (QID) | ORAL | 0 refills | Status: AC

## 2023-05-25 MED ORDER — EPINEPHRINE TOPICAL FOR CIRCUMCISION 0.1 MG/ML: 1.0000 [drp] | TOPICAL | Status: DC | PRN

## 2023-05-25 NOTE — Lactation Note (Signed)
 This note was copied from a baby's chart. Lactation Consultation Note  Patient Name: Tina [name] Date: 05/25/2023 Age:24 hours Reason for consult: Follow-up assessment;Maternal discharge;Term  P1, 39 wks, @ 48 hrs of age. Mom was asleep/ infant @ circumcision. Infant returning to mom- LC to room to set up DEBP. Discussed baby sleepy post circumcision, weight loss of 9%- may wish to pump and feed so breasts are not uncomfortable. Mom now awake- concerned about how long she slept- wheres her baby, wheres her lunch tray, wants vistaril- agitated- LC got bedside RN for meds. Pump set up- not completed with mom- "can not even think about that right now", encouraged mom- understandable- LC can return when mom ready.  Maternal Data    Feeding Mother's Current Feeding Choice: Breast Milk  Lactation Tools Discussed/Used Breast pump type: Double-Electric Breast Pump Reason for Pumping: Movement of milk/ breast stimulation/ initial weight loss  Interventions    Discharge    Consult Status Consult Status: Follow-up Date: 05/25/23 Follow-up type: In-patient    [facility] 05/25/2023, 1:18 PM

## 2023-05-26 ENCOUNTER — Ambulatory Visit (HOSPITAL_COMMUNITY): Payer: Self-pay

## 2023-05-26 NOTE — Lactation Note (Addendum)
 This note was copied from a baby's chart. Lactation Consultation Note  Patient Name: Tina [name] [id number] Date: 05/26/2023 Age:24 hours Reason for consult: Follow-up assessment;1st time breastfeeding  P1, Mother's medications Abilify L3 and Celexa L2 per Dwain Sarna Medications and Mother's Milk.  Baby is breastfeeding and being supplemented with 22 kcal Neosure with Gold Nfant nipple. Mother feels that baby is still showing hunger cues after breastfeeding so she has chosen to supplement with formula.    Last breastfeeding session 30 min with 20 ml supplementation of 22 kcal Neosure.  Noted pacifer in crib.  Pacifier use not recommended at this time.  Mother states baby has not been taking pacifier yet. Mother stated she has noted baby being more difficult at the breast now that baby has been bottle feeding.  Mother states she has been pace feeding.  LC would like to view next feeding but SLP may come due to spillage during bottle feeding per mother.  Reviewed engorgement care and monitoring voids/stools. Feed on demand with cues.  Suggested to stimulate mother's milk supply, breastfeed before formula.  Mother states she prefers to hand express versus pumping.   Maternal Data Has patient been taught Hand Expression?: Yes Does the patient have breastfeeding experience prior to this delivery?: No  Feeding Mother's Current Feeding Choice: Breast Milk and Formula Nipple Type: Nfant Extra Slow Flow (gold)   Interventions Interventions: Education  Discharge Discharge Education: Engorgement and breast care;Warning signs for feeding baby Pump: Personal;DEBP;Hands Free;Manual  Consult Status Consult Status: Complete Date: 05/26/23   Hardie Pulley  RN, IBCLC 05/26/2023, 9:15 AM

## 2023-05-28 ENCOUNTER — Encounter: Payer: Managed Care, Other (non HMO) | Admitting: Family Medicine

## 2023-05-30 ENCOUNTER — Ambulatory Visit

## 2023-05-30 VITALS — BP 114/80 | HR 105 | Wt 132.2 lb

## 2023-05-30 DIAGNOSIS — F319 Bipolar disorder, unspecified: Secondary | ICD-10-CM

## 2023-05-30 DIAGNOSIS — F259 Schizoaffective disorder, unspecified: Secondary | ICD-10-CM

## 2023-05-30 NOTE — Progress Notes (Signed)
 Tina [name]  [id number] here for postpartum depression screen. Pt is currently 1 weeks postpartum. Pt reports doing well and happy.   Edinburgh Postnatal Depression Scale - 05/30/23 1057       Edinburgh Postnatal Depression Scale:  In the Past 7 Days   I have been able to laugh and see the funny side of things. 0    I have looked forward with enjoyment to things. 0    I have blamed myself unnecessarily when things went wrong. 2    I have been anxious or worried for no good reason. 2    I have felt scared or panicky for no good reason. 1    Things have been getting on top of me. 1    I have been so unhappy that I have had difficulty sleeping. 0    I have felt sad or miserable. 1    I have been so unhappy that I have been crying. 1    The thought of harming myself has occurred to me. 0    Edinburgh Postnatal Depression Scale Total 8               Discussed with provider results of [name] 8-consistant with previous result from 05/24/23.  Pt to follow up With already scheduled PPV.

## 2023-06-02 ENCOUNTER — Telehealth: Payer: Self-pay | Admitting: Family Medicine

## 2023-06-02 NOTE — Telephone Encounter (Signed)
 Patient's voice box is full, MyChart message sent.  Dr Clent Ridges is leaving the practice and your New Patient or Transfer of Care appointment needs to be rescheduled with another provider. Please call the office to schedule a Transfer of Care to either Dr Charlann Lange, Darleen Crocker or Kara Dies, NP. [id number] please schedule if patient calls.

## 2023-06-30 ENCOUNTER — Telehealth: Payer: Self-pay | Admitting: Licensed Clinical Social Worker

## 2023-06-30 NOTE — Telephone Encounter (Signed)
 LCSW spoke with Jaslyn Mott, Care Manager regarding referral for this patient. LCSW informed Jaslyn that she could give patient resources to other providers and LCSW's contact information as well- LCSW agreed to speak with patient further to determine if a referral was appropriate due to history of mental health diagnoses.

## 2023-07-03 ENCOUNTER — Encounter: Payer: Self-pay | Admitting: Obstetrics & Gynecology

## 2023-07-03 ENCOUNTER — Ambulatory Visit: Admitting: Obstetrics & Gynecology

## 2023-07-03 DIAGNOSIS — Z3009 Encounter for other general counseling and advice on contraception: Secondary | ICD-10-CM | POA: Diagnosis not present

## 2023-07-03 MED ORDER — MISOPROSTOL 200 MCG PO TABS: ORAL_TABLET | ORAL | 2 refills | Status: AC

## 2023-07-03 NOTE — Addendum Note (Signed)
 Addended by: Lenoard Rad A on: 07/03/2023 12:19 PM   Modules accepted: Orders

## 2023-07-03 NOTE — Progress Notes (Addendum)
 Post Partum Visit Note  Tina [name] is a 23 y.o. G50P1011 female who presents for a postpartum visit. She is 6 weeks postpartum following a normal spontaneous vaginal delivery.  I have fully reviewed the prenatal and intrapartum course. The delivery was at [redacted]w[redacted]d gestational weeks.  Anesthesia: none. Postpartum course has been Well. Baby is doing well. Baby is feeding by bottle - Similac Neosure. Bleeding no bleeding. Bowel function is normal. Bladder function is normal. Patient is not sexually active. Contraception method is  Wants to discuss getting a Kyleena  . Postpartum depression screening: negative.  The pregnancy intention screening data noted above was reviewed. Potential methods of contraception were discussed. The patient elected to proceed with No data recorded.   Edinburgh Postnatal Depression Scale - 07/03/23 1113       Edinburgh Postnatal Depression Scale:  In the Past 7 Days   I have been able to laugh and see the funny side of things. 0    I have looked forward with enjoyment to things. 1    I have blamed myself unnecessarily when things went wrong. 1    I have been anxious or worried for no good reason. 1    I have felt scared or panicky for no good reason. 1    Things have been getting on top of me. 2    I have been so unhappy that I have had difficulty sleeping. 0    I have felt sad or miserable. 1    I have been so unhappy that I have been crying. 1    The thought of harming myself has occurred to me. 0    Edinburgh Postnatal Depression Scale Total 8            Health Maintenance Due  Topic Date Due   HPV VACCINES (1 - 3-dose series) Never done   Meningococcal B Vaccine (1 of 2 - Standard) Never done   DTaP/Tdap/Td (1 - Tdap) Never done   Pneumococcal Vaccine 54-74 Years old (1 of 2 - PCV) Never done   COVID-19 Vaccine (1 - 2024-25 season) Never done   The following portions of the patient's history were reviewed and updated as appropriate: allergies, current  medications, past family history, past medical history, past social history, past surgical history, and problem list.  Review of Systems Pertinent items noted in HPI and remainder of comprehensive ROS otherwise negative.  Objective:  BP 104/69   Pulse 87   Breastfeeding No    General:  alert and no distress   Breasts:  not indicated  Lungs: No respiratory distress noted  Heart:  regular rate noted  Abdomen: normal findings: soft, non-tender   GU exam:  not indicated       Assessment:   Normal postpartum exam.   Plan:   Essential components of care per ACOG recommendations:  1.  Mood and well being: Patient with negative depression screening today. Reviewed local resources for support.  - Patient tobacco use? No.   - hx of drug use? No.    2. Infant care and feeding:  -Patient currently breastmilk feeding? No.  -Social determinants of health (SDOH) reviewed in EPIC. No concerns.  3. Sexuality, contraception and birth spacing - Patient does not want a pregnancy in the next year.  Desired family size is 1 children.  - Reviewed reproductive life planning. Reviewed contraceptive methods based on pt preferences and effectiveness.  Patient desired Kyleena  IUD or IUS, wants to return for this. Advised  to premedicate and to avoid unprotected intercourse. She has used this in the past and had no issues.  Also requested preprocedural misoprostol , this was prescribed. - Discussed birth spacing of 18 months  4. Sleep and fatigue -Encouraged family/partner/community support of 4 hrs of uninterrupted sleep to help with mood and fatigue  5. Physical Recovery  - Discussed patients delivery and complications. She describes her labor as good. - Patient had a Vaginal, no problems at delivery. Patient had a  labial  laceration. Perineal healing reviewed. Patient expressed understanding - Patient has urinary incontinence? No. - Patient is safe to resume physical and sexual activity  6.   Health Maintenance - HM due items addressed Yes - Last pap smear  Diagnosis  Date Value Ref Range Status  10/05/2021   Final   - Negative for intraepithelial lesion or malignancy (NILM)   Pap smear not done at today's visit.  -Breast Cancer screening indicated? No.     Makaylen Thieme, MD Center for [facility], [facility]

## 2023-07-03 NOTE — Patient Instructions (Signed)
 Take Tylenol  1000 mg and Ibuprofen  800 mg prior to coming for IUD placement.  No unprotected intercourse for two weeks before IUD placement.

## 2023-07-08 DIAGNOSIS — F250 Schizoaffective disorder, bipolar type: Secondary | ICD-10-CM | POA: Diagnosis not present

## 2023-07-21 ENCOUNTER — Telehealth: Payer: Self-pay | Admitting: Licensed Clinical Social Worker

## 2023-07-21 NOTE — Telephone Encounter (Signed)
 Referred by Care Manager, Eugenio Hew.

## 2023-07-23 ENCOUNTER — Ambulatory Visit: Payer: Managed Care, Other (non HMO) | Admitting: Family Medicine

## 2023-07-28 NOTE — Progress Notes (Signed)
 Patient had to reschedule appointment.  Raford Bunk, MSN, CNM, RNC-OB Certified Nurse Midwife, [facility] 07/31/2023 12:09 PM

## 2023-07-31 ENCOUNTER — Ambulatory Visit: Admitting: Certified Nurse Midwife

## 2023-08-20 ENCOUNTER — Ambulatory Visit: Admitting: Certified Nurse Midwife

## 2023-08-20 VITALS — BP 104/68 | HR 96

## 2023-08-20 DIAGNOSIS — Z3202 Encounter for pregnancy test, result negative: Secondary | ICD-10-CM

## 2023-08-20 DIAGNOSIS — Z30430 Encounter for insertion of intrauterine contraceptive device: Secondary | ICD-10-CM

## 2023-08-20 LAB — POCT URINE PREGNANCY: Preg Test, Ur: NEGATIVE

## 2023-08-20 MED ORDER — LEVONORGESTREL 19.5 MG IU IUD: INTRAUTERINE_SYSTEM | Freq: Once | INTRAUTERINE | Status: AC

## 2023-08-20 NOTE — Progress Notes (Signed)
 Did place her cytotec  around 12:30

## 2023-08-21 NOTE — Progress Notes (Addendum)
   GYNECOLOGY CLINIC PROCEDURE NOTE  [name] is a 23 y.o. G2P1011 here for Kyleena  IUD insertion. No GYN concerns.  Last pap smear was on 10/05/2021 and was normal.  IUD Insertion Procedure Note Patient identified, informed consent performed.  Discussed risks of irregular bleeding, cramping, infection, malpositioning or misplacement of the IUD outside the uterus which may require further procedure such as laparoscopy. Time out was performed.  Urine pregnancy test negative.  Speculum placed in the vagina.  Cervix visualized.  Cleaned with Chlorhexidine x 3.  Uterus sounded to 7 cm.  Kyleena  IUD placed per manufacturer's recommendations.  Strings trimmed to 3 cm. Tenaculum was removed, good hemostasis noted.  Patient tolerated procedure well.   Patient was given post-procedure instructions.  She was advised to be have backup contraception for one week.  Patient was also asked to check IUD strings periodically and follow up in 4 weeks for IUD check.  Emilio Delilah HERO, CNM 08/21/2023 4:13 PM
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
## 2023-09-24 ENCOUNTER — Ambulatory Visit: Admitting: Certified Nurse Midwife

## 2023-09-24 VITALS — BP 111/74 | HR 62 | Wt 124.0 lb

## 2023-09-24 DIAGNOSIS — F39 Unspecified mood [affective] disorder: Secondary | ICD-10-CM

## 2023-09-24 DIAGNOSIS — Z975 Presence of (intrauterine) contraceptive device: Secondary | ICD-10-CM | POA: Diagnosis not present
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
## 2023-10-22 ENCOUNTER — Ambulatory Visit (INDEPENDENT_AMBULATORY_CARE_PROVIDER_SITE_OTHER): Admitting: Certified Nurse Midwife

## 2023-10-22 VITALS — BP 111/69 | HR 87

## 2023-10-22 DIAGNOSIS — Z113 Encounter for screening for infections with a predominantly sexual mode of transmission: Secondary | ICD-10-CM

## 2023-10-22 DIAGNOSIS — Z30432 Encounter for removal of intrauterine contraceptive device: Secondary | ICD-10-CM | POA: Diagnosis not present

## 2023-10-22 DIAGNOSIS — Z3009 Encounter for other general counseling and advice on contraception: Secondary | ICD-10-CM

## 2023-10-22 NOTE — Progress Notes (Signed)
    GYNECOLOGY CLINIC PROCEDURE NOTE  [name] is a 23 y.o. G2P1011 here for liletta  IUD removal. No GYN concerns.  Last pap smear was on 10/05/21 and was normal.  IUD Removal  Patient was in the dorsal lithotomy position, normal external genitalia was noted.  A speculum was placed in the patient's vagina, normal discharge was noted, no lesions. The multiparous cervix was visualized, no lesions, no abnormal discharge.  The strings of the IUD were grasped and pulled using ring forceps. The IUD was removed in its entirety.  Patient tolerated the procedure well.    Patient will use condoms for contraception. Routine preventative health maintenance measures emphasized.  Emilio Delilah HERO, CNM 10/22/2023 4:10 PM

## 2023-10-24 LAB — NUSWAB VG+, CANDIDA 6SP
Atopobium vaginae: HIGH {score} — AB
BVAB 2: HIGH {score} — AB
C PARAPSILOSIS/TROPICALIS: NEGATIVE
Candida albicans, NAA: NEGATIVE
Candida glabrata, NAA: NEGATIVE
Candida krusei, NAA: NEGATIVE
Candida lusitaniae, NAA: NEGATIVE
Chlamydia trachomatis, NAA: NEGATIVE
Megasphaera 1: HIGH {score} — AB
Neisseria gonorrhoeae, NAA: NEGATIVE
Trich vag by NAA: NEGATIVE

## 2023-10-27 ENCOUNTER — Encounter: Payer: Self-pay | Admitting: Certified Nurse Midwife

## 2023-12-30 ENCOUNTER — Other Ambulatory Visit: Payer: Self-pay

## 2023-12-30 DIAGNOSIS — B9689 Other specified bacterial agents as the cause of diseases classified elsewhere: Secondary | ICD-10-CM

## 2023-12-30 MED ORDER — METRONIDAZOLE 500 MG PO TABS: 500.0000 mg | ORAL_TABLET | Freq: Two times a day (BID) | ORAL | 0 refills | Status: AC

## 2023-12-30 NOTE — Progress Notes (Signed)
 Pt walked in requesting treatment for BV or appt. Recent swab was done already pt just needs treatment Rx sent.

## 2024-01-07 ENCOUNTER — Emergency Department

## 2024-01-07 ENCOUNTER — Other Ambulatory Visit: Payer: Self-pay

## 2024-01-07 ENCOUNTER — Emergency Department
Admission: EM | Admit: 2024-01-07 | Discharge: 2024-01-07 | Disposition: A | Discharge status: Home / Self Care | Attending: Emergency Medicine | Admitting: Emergency Medicine

## 2024-01-07 DIAGNOSIS — R5383 Other fatigue: Secondary | ICD-10-CM | POA: Diagnosis not present

## 2024-01-07 DIAGNOSIS — R0602 Shortness of breath: Secondary | ICD-10-CM | POA: Diagnosis present

## 2024-01-07 DIAGNOSIS — D649 Anemia, unspecified: Secondary | ICD-10-CM | POA: Insufficient documentation

## 2024-01-07 LAB — COMPREHENSIVE METABOLIC PANEL WITH GFR
ALT: 17 U/L (ref 0–44)
AST: 27 U/L (ref 15–41)
Albumin: 4.6 g/dL (ref 3.5–5.0)
Alkaline Phosphatase: 65 U/L (ref 38–126)
Anion gap: 10 (ref 5–15)
BUN: 15 mg/dL (ref 6–20)
CO2: 26 mmol/L (ref 22–32)
Calcium: 9.5 mg/dL (ref 8.9–10.3)
Chloride: 106 mmol/L (ref 98–111)
Creatinine, Ser: 0.63 mg/dL (ref 0.44–1.00)
GFR, Estimated: >60 mL/min (ref >60)
Glucose, Bld: 127 mg/dL — ABNORMAL HIGH (ref 70–99)
Potassium: 5.3 mmol/L — ABNORMAL HIGH (ref 3.5–5.1)
Sodium: 142 mmol/L (ref 135–145)
Total Bilirubin: 0.7 mg/dL (ref 0.0–1.2)
Total Protein: 7.9 g/dL (ref 6.5–8.1)

## 2024-01-07 LAB — APTT: aPTT: 28 s (ref 24–36)

## 2024-01-07 LAB — CBC
HCT: 36 % (ref 36.0–46.0)
Hemoglobin: 11.6 g/dL — ABNORMAL LOW (ref 12.0–15.0)
MCH: 27 pg (ref 26.0–34.0)
MCHC: 32.2 g/dL (ref 30.0–36.0)
MCV: 83.9 fL (ref 80.0–100.0)
Platelets: 263 K/uL (ref 150–400)
RBC: 4.29 MIL/uL (ref 3.87–5.11)
RDW: 14.9 % (ref 11.5–15.5)
WBC: 5.9 K/uL (ref 4.0–10.5)
nRBC: 0 % (ref 0.0–0.2)

## 2024-01-07 LAB — D-DIMER, QUANTITATIVE: D-Dimer, Quant: <0.27 ug{FEU}/mL (ref 0.00–0.50)

## 2024-01-07 LAB — TSH: TSH: 0.663 u[IU]/mL (ref 0.350–4.500)

## 2024-01-07 LAB — PROTIME-INR
INR: 1 (ref 0.8–1.2)
Prothrombin Time: 13.8 s (ref 11.4–15.2)

## 2024-01-07 LAB — MAGNESIUM: Magnesium: 1.9 mg/dL (ref 1.7–2.4)

## 2024-01-07 MED ORDER — SODIUM CHLORIDE 0.9 % IV BOLUS
1000.0000 mL | Freq: Once | INTRAVENOUS | Status: AC
  Administered 2024-01-07: 1000 mL via INTRAVENOUS
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
## 2024-01-07 NOTE — ED Notes (Signed)
 Pt states she has been having increase weakness, dizziness and brain fog for the last several months after giving birth. She believes her anemia has gotten worse and is potentially the cause of her symptoms.

## 2024-01-07 NOTE — Discharge Instructions (Addendum)
 Your testing today was fortunately reassuring.  Follow-up with a primary care doctor for further evaluation.  Return to the ER for any new or worsening symptoms.

## 2024-01-07 NOTE — ED Triage Notes (Addendum)
 Pt sts that she has been having anemia problems since giving birth 7 months ago. Pt sts that she has been having seizure like activity as well as electrical shock waves through her body as well. Pt sts that since giving birth she has lost 40 lbs. Pt also sts that she has been having bruising on her legs also that have been happening with no injury.

## 2024-01-07 NOTE — ED Provider Notes (Signed)
 Care of this patient assumed from prior physician at 1500 pending completion of lab work and disposition. Please see prior physician note for further details.  This is a 23 year old female who presented with fatigue.  Several labs pending at time of signout.  Anticipated discharge if these are reassuring.  Lab work resulted with CBC with mild anemia but improved from recent prior, CMP without critical derangements.  Normal magnesium .  Normal TSH.  Normal PTT and INR.  Negative D-dimer.  X-Tina [name] without acute findings.  Patient reassessed and updated on results of workup.  She is comfortable with discharge and outpatient follow-up.  She will follow-up with her primary care doctor for further evaluation.  She was given information for follow-up with urology if recommended by her PCP.  Strict return precautions provided.  Patient discharged stable condition.   Levander Slate, MD 01/07/24 [phone]

## 2024-03-28 ENCOUNTER — Emergency Department
Admission: EM | Admit: 2024-03-28 | Discharge: 2024-03-28 | Disposition: A | Discharge status: Home / Self Care | Payer: MEDICAID | Attending: Emergency Medicine | Admitting: Emergency Medicine

## 2024-03-28 ENCOUNTER — Other Ambulatory Visit: Payer: Self-pay

## 2024-03-28 DIAGNOSIS — Z139 Encounter for screening, unspecified: Secondary | ICD-10-CM | POA: Diagnosis not present

## 2024-03-28 DIAGNOSIS — R519 Headache, unspecified: Secondary | ICD-10-CM | POA: Diagnosis present

## 2024-03-28 HISTORY — DX: Migraine, unspecified, not intractable, without status migrainosus: G43.909

## 2024-03-28 LAB — BASIC METABOLIC PANEL WITH GFR
Anion gap: 7 (ref 5–15)
BUN: 15 mg/dL (ref 6–20)
CO2: 24 mmol/L (ref 22–32)
Calcium: 9.1 mg/dL (ref 8.9–10.3)
Chloride: 108 mmol/L (ref 98–111)
Creatinine, Ser: 0.51 mg/dL (ref 0.44–1.00)
GFR, Estimated: >60 mL/min
Glucose, Bld: 66 mg/dL — ABNORMAL LOW (ref 70–99)
Potassium: 4.2 mmol/L (ref 3.5–5.1)
Sodium: 139 mmol/L (ref 135–145)

## 2024-03-28 LAB — CBC WITH DIFFERENTIAL/PLATELET
Abs Immature Granulocytes: 0.01 K/uL (ref 0.00–0.07)
Basophils Absolute: 0 K/uL (ref 0.0–0.1)
Basophils Relative: 1 %
Eosinophils Absolute: 0.3 K/uL (ref 0.0–0.5)
Eosinophils Relative: 4 %
HCT: 32.5 % — ABNORMAL LOW (ref 36.0–46.0)
Hemoglobin: 10.5 g/dL — ABNORMAL LOW (ref 12.0–15.0)
Immature Granulocytes: 0 %
Lymphocytes Relative: 38 %
Lymphs Abs: 2.9 K/uL (ref 0.7–4.0)
MCH: 27.1 pg (ref 26.0–34.0)
MCHC: 32.3 g/dL (ref 30.0–36.0)
MCV: 84 fL (ref 80.0–100.0)
Monocytes Absolute: 0.8 K/uL (ref 0.1–1.0)
Monocytes Relative: 10 %
Neutro Abs: 3.6 K/uL (ref 1.7–7.7)
Neutrophils Relative %: 47 %
Platelets: 287 K/uL (ref 150–400)
RBC: 3.87 MIL/uL (ref 3.87–5.11)
RDW: 15.2 % (ref 11.5–15.5)
WBC: 7.6 K/uL (ref 4.0–10.5)
nRBC: 0 % (ref 0.0–0.2)

## 2024-03-28 MED ORDER — KETOROLAC TROMETHAMINE 30 MG/ML IJ SOLN
15.0000 mg | Freq: Once | INTRAMUSCULAR | Status: AC
  Administered 2024-03-28: 15 mg via INTRAVENOUS
  Filled 2024-03-28: qty 1

## 2024-03-28 MED ORDER — LACTATED RINGERS IV BOLUS
1000.0000 mL | Freq: Once | INTRAVENOUS | Status: AC
  Administered 2024-03-28: 1000 mL via INTRAVENOUS

## 2024-03-28 MED ORDER — DROPERIDOL 2.5 MG/ML IJ SOLN
2.5000 mg | Freq: Once | INTRAMUSCULAR | Status: DC
  Filled 2024-03-28: qty 2

## 2024-03-28 MED ORDER — ACETAMINOPHEN 500 MG PO TABS
1000.0000 mg | ORAL_TABLET | Freq: Once | ORAL | Status: DC
  Filled 2024-03-28: qty 2

## 2024-03-28 NOTE — ED Notes (Signed)
 Sent lt green, lavendar and red tubes in case bloodwork ordered.

## 2024-03-28 NOTE — ED Notes (Signed)
 This RN at bedside to administer medications. Pt refused and states she not longer wants to stay here. Dr. Claudene at bedside and spoke with pt states she is okay to leave, and family agreeable with that plan. This RN removed IV and pt left with family. Pt was discharged but did not stay to wait for discharge paperwork.

## 2024-03-28 NOTE — ED Triage Notes (Signed)
 Pt to ED for possible seizure, states I think it's my right temporal lobe, it's not a panic attack, it's not anxiety, it feels like static and buzzing in my brain. States my teeth hurt, my eye feels heavy, I feel the pressure in the right temporal lobe area. States when it happens, I have twitching mostly in my cheek and jaw. Also I start crying out of nowhere, sometimes I get choked up on my own spit, I have a history of migraines in the past. States has seen ENT and has neurologist. Per [facility] does have hx biploar 1 and schizoaffective disorder. States my mood swings are uncalled for!. Pt denies SI.

## 2024-03-28 NOTE — ED Provider Notes (Signed)
 "  [facility] Provider Note    Event Date/Time   First MD Initiated Contact with Patient 03/28/24 [phone]     (approximate)   History   Multiple complaints and Headache   HPI  Tina [name] is a 24 y.o. female who presents to the ED for evaluation of Multiple complaints and Headache   Patient history of schizoaffective disorder presents to the ED with sensation of vibrations in the right side of her head that been present since October or November.   Physical Exam   Triage Vital Signs: ED Triage Vitals  Encounter Vitals Group     BP 03/28/24 0729 (!) 133/99     Girls Systolic BP Percentile --      Girls Diastolic BP Percentile --      Boys Systolic BP Percentile --      Boys Diastolic BP Percentile --      Pulse Rate 03/28/24 0729 92     Resp 03/28/24 0729 20     Temp 03/28/24 0729 98.2 F (36.8 C)     Temp Source 03/28/24 0729 Axillary     SpO2 03/28/24 0729 100 %     Weight 03/28/24 0727 110 lb (49.9 kg)     Height 03/28/24 0727 5' 3 (1.6 m)     Head Circumference --      Peak Flow --      Pain Score 03/28/24 0726 7     Pain Loc --      Pain Education --      Exclude from Growth Chart --     Most recent vital signs: Vitals:   03/28/24 0729  BP: (!) 133/99  Pulse: 92  Resp: 20  Temp: 98.2 F (36.8 C)  SpO2: 100%    General: Awake, no distress.  CV:  Good peripheral perfusion.  Resp:  Normal effort.  Abd:  No distention.  MSK:  No deformity noted.  Neuro:  No focal deficits appreciated. Cranial nerves II through XII intact 5/5 strength and sensation in all 4 extremities Other:     ED Results / Procedures / Treatments   Labs (all labs ordered are listed, but only abnormal results are displayed) Labs Reviewed  CBC WITH DIFFERENTIAL/PLATELET - Abnormal; Notable for the following components:      Result Value   Hemoglobin 10.5 (*)    HCT 32.5 (*)    All other components within normal limits  BASIC METABOLIC PANEL WITH GFR -  Abnormal; Notable for the following components:   Glucose, Bld 66 (*)    All other components within normal limits  URINALYSIS, ROUTINE W REFLEX MICROSCOPIC  POC URINE PREG, ED    EKG   RADIOLOGY   Official radiology report(s): No results found.  PROCEDURES and INTERVENTIONS:  Procedures  Medications  acetaminophen  (TYLENOL ) tablet 1,000 mg (1,000 mg Oral Patient Refused/Not Given 03/28/24 0845)  droperidol  (INAPSINE ) 2.5 MG/ML injection 2.5 mg (2.5 mg Intravenous Patient Refused/Not Given 03/28/24 1014)  ketorolac  (TORADOL ) 30 MG/ML injection 15 mg (15 mg Intravenous Given 03/28/24 0843)  lactated ringers  bolus 1,000 mL (0 mLs Intravenous Stopped 03/28/24 1014)     IMPRESSION / MDM / ASSESSMENT AND PLAN / ED COURSE  I reviewed the triage vital signs and the nursing notes.  Differential diagnosis includes, but is not limited to, decompensated mental illness, ICH, migraine headache  {Patient presents with symptoms of an acute illness or injury that is potentially life-threatening.  Patient presents to the ED with  her grandmother for evaluation of various chronic symptoms without evidence of acute pathology and suitable for outpatient management.  Reassuring vital signs, exams and basic labs with normal CBC.  Metabolic panel notable with glucose of 66 and she is provided oral nutrition after this, refuses a repeat CBG.  Grandmother reports that patient is at her baseline.  Had ordered a migraine cocktail for the patient, there were some delays in patient receiving droperidol  and by time nursing staff went in there to provide it she demanded discharge and grandma is in agreement.  No barriers to outpatient management  Clinical Course as of 03/28/24 1030  Sun Mar 28, 2024  1011 Reassessed, patient demanding discharge [DS]    Clinical Course User Index [DS] Claudene Rover, MD     FINAL CLINICAL IMPRESSION(S) / ED DIAGNOSES   Final diagnoses:  Encounter for medical screening  examination     Rx / DC Orders   ED Discharge Orders     None        Note:  This document was prepared using Dragon voice recognition software and may include unintentional dictation errors.   Claudene Rover, MD 03/28/24 1030  "

## 2024-05-14 ENCOUNTER — Ambulatory Visit: Payer: MEDICAID | Admitting: Family Medicine
